# Patient Record
Sex: Female | Born: 1960 | Race: Black or African American | Hispanic: No | Marital: Single | Smoking: Never smoker
Health system: Southern US, Community
[De-identification: ages and names within clinical notes are randomized; demographics above are authoritative.]

## PROBLEM LIST (undated history)

## (undated) DIAGNOSIS — I639 Cerebral infarction, unspecified: Secondary | ICD-10-CM

## (undated) DIAGNOSIS — E041 Nontoxic single thyroid nodule: Secondary | ICD-10-CM

## (undated) DIAGNOSIS — I10 Essential (primary) hypertension: Secondary | ICD-10-CM

## (undated) DIAGNOSIS — J45909 Unspecified asthma, uncomplicated: Secondary | ICD-10-CM

## (undated) DIAGNOSIS — R569 Unspecified convulsions: Secondary | ICD-10-CM

## (undated) DIAGNOSIS — K219 Gastro-esophageal reflux disease without esophagitis: Secondary | ICD-10-CM

## (undated) HISTORY — PX: PARTIAL HYSTERECTOMY: SHX80

---

## 2006-04-20 HISTORY — PX: CORONARY STENT PLACEMENT: SHX1402

## 2008-10-14 ENCOUNTER — Emergency Department (HOSPITAL_COMMUNITY): Admission: EM | Admit: 2008-10-14 | Discharge: 2008-10-14 | Payer: Self-pay | Admitting: Family Medicine

## 2008-10-22 ENCOUNTER — Emergency Department (HOSPITAL_COMMUNITY): Admission: EM | Admit: 2008-10-22 | Discharge: 2008-10-22 | Payer: Self-pay | Admitting: Emergency Medicine

## 2010-07-27 LAB — POCT I-STAT, CHEM 8
BUN: 10 mg/dL (ref 6–23)
Chloride: 103 mEq/L (ref 96–112)
HCT: 44 % (ref 36.0–46.0)
Potassium: 3.9 mEq/L (ref 3.5–5.1)

## 2010-07-27 LAB — HEMOCCULT GUIAC POC 1CARD (OFFICE): Fecal Occult Bld: NEGATIVE

## 2011-10-03 ENCOUNTER — Encounter (HOSPITAL_COMMUNITY): Payer: Self-pay | Admitting: Emergency Medicine

## 2011-10-03 ENCOUNTER — Emergency Department (HOSPITAL_COMMUNITY)
Admission: EM | Admit: 2011-10-03 | Discharge: 2011-10-03 | Disposition: A | Discharge status: Home / Self Care | Payer: Medicare Other | Attending: Emergency Medicine | Admitting: Emergency Medicine

## 2011-10-03 ENCOUNTER — Emergency Department (HOSPITAL_COMMUNITY): Payer: Medicare Other

## 2011-10-03 DIAGNOSIS — Z880 Allergy status to penicillin: Secondary | ICD-10-CM | POA: Insufficient documentation

## 2011-10-03 DIAGNOSIS — E119 Type 2 diabetes mellitus without complications: Secondary | ICD-10-CM | POA: Insufficient documentation

## 2011-10-03 DIAGNOSIS — Z79899 Other long term (current) drug therapy: Secondary | ICD-10-CM | POA: Insufficient documentation

## 2011-10-03 DIAGNOSIS — K219 Gastro-esophageal reflux disease without esophagitis: Secondary | ICD-10-CM | POA: Insufficient documentation

## 2011-10-03 DIAGNOSIS — R079 Chest pain, unspecified: Secondary | ICD-10-CM | POA: Insufficient documentation

## 2011-10-03 DIAGNOSIS — R569 Unspecified convulsions: Secondary | ICD-10-CM | POA: Insufficient documentation

## 2011-10-03 DIAGNOSIS — I10 Essential (primary) hypertension: Secondary | ICD-10-CM | POA: Insufficient documentation

## 2011-10-03 DIAGNOSIS — F419 Anxiety disorder, unspecified: Secondary | ICD-10-CM

## 2011-10-03 DIAGNOSIS — F411 Generalized anxiety disorder: Secondary | ICD-10-CM | POA: Insufficient documentation

## 2011-10-03 DIAGNOSIS — J45909 Unspecified asthma, uncomplicated: Secondary | ICD-10-CM | POA: Insufficient documentation

## 2011-10-03 DIAGNOSIS — Z888 Allergy status to other drugs, medicaments and biological substances status: Secondary | ICD-10-CM | POA: Insufficient documentation

## 2011-10-03 DIAGNOSIS — Z886 Allergy status to analgesic agent status: Secondary | ICD-10-CM | POA: Insufficient documentation

## 2011-10-03 HISTORY — DX: Essential (primary) hypertension: I10

## 2011-10-03 HISTORY — DX: Gastro-esophageal reflux disease without esophagitis: K21.9

## 2011-10-03 HISTORY — DX: Unspecified asthma, uncomplicated: J45.909

## 2011-10-03 HISTORY — DX: Unspecified convulsions: R56.9

## 2011-10-03 LAB — DIFFERENTIAL
Basophils Absolute: 0 10*3/uL (ref 0.0–0.1)
Lymphocytes Relative: 47 % — ABNORMAL HIGH (ref 12–46)
Lymphs Abs: 3.3 10*3/uL (ref 0.7–4.0)
Monocytes Absolute: 0.7 10*3/uL (ref 0.1–1.0)
Monocytes Relative: 10 % (ref 3–12)
Neutro Abs: 2.9 10*3/uL (ref 1.7–7.7)

## 2011-10-03 LAB — BASIC METABOLIC PANEL
BUN: 10 mg/dL (ref 6–23)
CO2: 28 mEq/L (ref 19–32)
Chloride: 100 mEq/L (ref 96–112)
Creatinine, Ser: 0.91 mg/dL (ref 0.50–1.10)
Glucose, Bld: 123 mg/dL — ABNORMAL HIGH (ref 70–99)

## 2011-10-03 LAB — URINALYSIS, ROUTINE W REFLEX MICROSCOPIC
Glucose, UA: NEGATIVE mg/dL
Hgb urine dipstick: NEGATIVE
Ketones, ur: NEGATIVE mg/dL
Leukocytes, UA: NEGATIVE
pH: 7.5 (ref 5.0–8.0)

## 2011-10-03 LAB — POCT I-STAT TROPONIN I: Troponin i, poc: 0 ng/mL (ref 0.00–0.08)

## 2011-10-03 LAB — CBC
HCT: 36.5 % (ref 36.0–46.0)
Hemoglobin: 11.9 g/dL — ABNORMAL LOW (ref 12.0–15.0)
RBC: 5.4 MIL/uL — ABNORMAL HIGH (ref 3.87–5.11)
WBC: 7 10*3/uL (ref 4.0–10.5)

## 2011-10-03 MED ORDER — NITROGLYCERIN 0.4 MG SL SUBL
0.4000 mg | SUBLINGUAL_TABLET | SUBLINGUAL | Status: DC | PRN
  Administered 2011-10-03: 0.4 mg via SUBLINGUAL

## 2011-10-03 MED ORDER — LORAZEPAM 1 MG PO TABS: 1.0000 mg | ORAL_TABLET | Freq: Three times a day (TID) | ORAL | Status: AC | PRN

## 2011-10-03 MED ORDER — CARVEDILOL 25 MG PO TABS
25.0000 mg | ORAL_TABLET | Freq: Once | ORAL | Status: AC
  Administered 2011-10-03: 25 mg via ORAL
  Filled 2011-10-03 (×2): qty 1

## 2011-10-03 MED ORDER — LORAZEPAM 1 MG PO TABS
1.0000 mg | ORAL_TABLET | Freq: Once | ORAL | Status: AC
  Administered 2011-10-03: 1 mg via ORAL
  Filled 2011-10-03: qty 1

## 2011-10-03 NOTE — ED Notes (Signed)
PER EMS- Pt picked up from [facility] for CP. Pt very anxious. Pain radiates to jaw. Previous stents placed. 1 NTG, relieved pain. Pain increases upon palpation. Allergic to aspirin, PCN, Ultram, Sulfa drugs. Vitals WNL. Alertx4.

## 2011-10-03 NOTE — ED Notes (Signed)
Pt aslo report that she has seen GI doc for blood in her stool

## 2011-10-03 NOTE — ED Provider Notes (Signed)
History     CSN: [id number]  Arrival date & time 10/03/11  0048   First MD Initiated Contact with Patient 10/03/11 0107      Chief Complaint  Patient presents with  . Chest Pain    (Consider location/radiation/quality/duration/timing/severity/associated sxs/prior treatment) Patient is a 51 y.o. female presenting with chest pain. The history is provided by the patient.  Chest Pain The chest pain began 6 - 12 hours ago. Chest pain occurs constantly. The chest pain is unchanged. The severity of the pain is moderate. The quality of the pain is described as pressure-like. The pain radiates to the left jaw and left neck. Chest pain is worsened by stress. Primary symptoms include fatigue, nausea and dizziness. Pertinent negatives for primary symptoms include no fever, no shortness of breath, no cough, no wheezing, no palpitations, no abdominal pain and no vomiting.  Dizziness also occurs with nausea and weakness. Dizziness does not occur with vomiting.   Associated symptoms include lower extremity edema and weakness. She tried nitroglycerin for the symptoms.     Past Medical History  Diagnosis Date  . Diabetes mellitus   . Hypertension   . Seizures   . Asthma   . GERD (gastroesophageal reflux disease)     History reviewed. No pertinent past surgical history.  No family history on file.  History  Substance Use Topics  . Smoking status: Not on file  . Smokeless tobacco: Not on file  . Alcohol Use:     OB History    Grav Para Term Preterm Abortions TAB SAB Ect Mult Living                  Review of Systems  Constitutional: Positive for fatigue. Negative for fever.  Respiratory: Negative for cough, shortness of breath and wheezing.   Cardiovascular: Positive for chest pain. Negative for palpitations.  Gastrointestinal: Positive for nausea. Negative for vomiting and abdominal pain.  Neurological: Positive for dizziness and weakness.    Allergies  Aspirin; Ultram;  Valtrex; and Penicillins  Home Medications   Current Outpatient Rx  Name Route Sig Dispense Refill  . ACETAMINOPHEN 500 MG PO TABS Oral Take 500 mg by mouth every 6 (six) hours as needed. For pain    . AMITRIPTYLINE HCL PO Oral Take by mouth.    . ATORVASTATIN CALCIUM 20 MG PO TABS Oral Take 20 mg by mouth daily.    . AZILSARTAN MEDOXOMIL 80 MG PO TABS Oral Take 80 mg by mouth daily.    Marland Kitchen CARVEDILOL 25 MG PO TABS Oral Take 25 mg by mouth 2 (two) times daily with a meal.    . CHLORTHALIDONE 25 MG PO TABS Oral Take 25 mg by mouth daily.    Marland Kitchen CLOPIDOGREL BISULFATE 75 MG PO TABS Oral Take 75 mg by mouth daily.    Marland Kitchen DIVALPROEX SODIUM 500 MG PO TBEC Oral Take 500 mg by mouth 3 (three) times daily.    Marland Kitchen DOXEPIN HCL 25 MG PO CAPS Oral Take 25 mg by mouth every other day.    . ESOMEPRAZOLE MAGNESIUM 40 MG PO CPDR Oral Take 40 mg by mouth daily before breakfast.    . FLUTICASONE-SALMETEROL 250-50 MCG/DOSE IN AEPB Inhalation Inhale 1 puff into the lungs daily.    Marland Kitchen METFORMIN HCL 500 MG PO TABS Oral Take 500 mg by mouth 2 (two) times daily as needed. According to cbg, for sugar    . NITROGLYCERIN 0.4 MG SL SUBL Sublingual Place 0.4 mg under the  tongue every 5 (five) minutes as needed. For chest pain    . PREGABALIN 100 MG PO CAPS Oral Take 100 mg by mouth daily.    Marland Kitchen RANITIDINE HCL 150 MG PO TABS Oral Take 150 mg by mouth daily as needed. For indigestion      BP 154/86  Pulse 84  Temp 98.5 F (36.9 C) (Oral)  Resp 22  SpO2 100%  Physical Exam  Nursing note and vitals reviewed. Constitutional: She is oriented to person, place, and time. She appears well-developed and well-nourished. No distress.  Eyes: Conjunctivae are normal.  Neck: Neck supple.  Cardiovascular: Normal rate, regular rhythm and normal heart sounds.   Pulmonary/Chest: Effort normal and breath sounds normal. No respiratory distress. She has no wheezes. She has no rales.  Abdominal: Soft. Bowel sounds are normal. She exhibits no  distension. There is no tenderness.  Musculoskeletal: Normal range of motion.       1+ bilateral lower extremity edema  Neurological: She is alert and oriented to person, place, and time.  Skin: Skin is warm and dry.  Psychiatric: She has a normal mood and affect.    ED Course  Procedures (including critical care time)   Date: 10/03/2011  Rate: 83  Rhythm: normal sinus rhythm  QRS Axis: normal  Intervals: normal  ST/T Wave abnormalities: normal  Conduction Disutrbances:none  Narrative Interpretation:   Old EKG Reviewed: none available  Pt with chest pain, sounds atypical, pain is constant, no sob. Will get enzymes, labs Results for orders placed during the hospital encounter of 10/03/11  CBC      Component Value Range   WBC 7.0  4.0 - 10.5 K/uL   RBC 5.40 (*) 3.87 - 5.11 MIL/uL   Hemoglobin 11.9 (*) 12.0 - 15.0 g/dL   HCT 16.1  09.6 - 04.5 %   MCV 67.6 (*) 78.0 - 100.0 fL   MCH 22.0 (*) 26.0 - 34.0 pg   MCHC 32.6  30.0 - 36.0 g/dL   RDW 40.9 (*) 81.1 - 91.4 %   Platelets 240  150 - 400 K/uL  DIFFERENTIAL      Component Value Range   Neutrophils Relative 41 (*) 43 - 77 %   Neutro Abs 2.9  1.7 - 7.7 K/uL   Lymphocytes Relative 47 (*) 12 - 46 %   Lymphs Abs 3.3  0.7 - 4.0 K/uL   Monocytes Relative 10  3 - 12 %   Monocytes Absolute 0.7  0.1 - 1.0 K/uL   Eosinophils Relative 1  0 - 5 %   Eosinophils Absolute 0.1  0.0 - 0.7 K/uL   Basophils Relative 0  0 - 1 %   Basophils Absolute 0.0  0.0 - 0.1 K/uL  BASIC METABOLIC PANEL      Component Value Range   Sodium 139  135 - 145 mEq/L   Potassium 3.4 (*) 3.5 - 5.1 mEq/L   Chloride 100  96 - 112 mEq/L   CO2 28  19 - 32 mEq/L   Glucose, Bld 123 (*) 70 - 99 mg/dL   BUN 10  6 - 23 mg/dL   Creatinine, Ser 7.82  0.50 - 1.10 mg/dL   Calcium 9.6  8.4 - 95.6 mg/dL   GFR calc non Af Amer 72 (*) >90 mL/min   GFR calc Af Amer 83 (*) >90 mL/min  URINALYSIS, ROUTINE W REFLEX MICROSCOPIC      Component Value Range   Color, Urine  YELLOW  YELLOW  APPearance CLOUDY (*) CLEAR   Specific Gravity, Urine 1.012  1.005 - 1.030   pH 7.5  5.0 - 8.0   Glucose, UA NEGATIVE  NEGATIVE mg/dL   Hgb urine dipstick NEGATIVE  NEGATIVE   Bilirubin Urine NEGATIVE  NEGATIVE   Ketones, ur NEGATIVE  NEGATIVE mg/dL   Protein, ur NEGATIVE  NEGATIVE mg/dL   Urobilinogen, UA 0.2  0.0 - 1.0 mg/dL   Nitrite NEGATIVE  NEGATIVE   Leukocytes, UA NEGATIVE  NEGATIVE  POCT I-STAT TROPONIN I      Component Value Range   Troponin i, poc 0.00  0.00 - 0.08 ng/mL   Comment 3           POCT I-STAT TROPONIN I      Component Value Range   Troponin i, poc 0.00  0.00 - 0.08 ng/mL   Comment 3            Dg Chest 2 View  10/03/2011  *RADIOLOGY REPORT*  Clinical Data: Worsening chest pain.  CHEST - 2 VIEW  Comparison: None.  Findings: The lungs are well-aerated.  Minimal bibasilar opacities may reflect atelectasis or scarring.  There is no evidence of pleural effusion or pneumothorax.  The heart is borderline normal in size; the mediastinal contour is within normal limits.  No acute osseous abnormalities are seen.  IMPRESSION: Minimal bibasilar airspace opacities may reflect atelectasis or scarring.  No acute cardiopulmonary process seen.  Original Report Authenticated By: Tonia Ghent, M.D.    5:31 AM No signinficant findings to explain pt's pain on CXR or labs. Two sets of enzymes obtained are negative. Pt is very anxious, pts daughter apparently at the hospital right now. Discussed results and plan with the pt. Pt would rather not stay in the hospital, she wants to go be with her daughter. I do not suspect this is cardiac. Will d/c home with follow up with her PCP and cardiologist on Monday.     1. Chest pain   2. Anxiety       MDM          Lottie Mussel, PA 10/03/11 [phone]

## 2011-10-03 NOTE — ED Notes (Signed)
Pt continues to c/o L clavicle pain.  PA Lemont Fillers is aware.  Pain increases when she moves.

## 2011-10-03 NOTE — Discharge Instructions (Signed)
Your labs and x-ray are not significant for any major findings. Cotninue all your current medications. Take tylenol for pain. Take ativan for anxiety as prescribed as needed. Follow up with your doctor on Monday. Return if worsening.   Chest Pain (Nonspecific) It is often hard to give a specific diagnosis for the cause of chest pain. There is always a chance that your pain could be related to something serious, such as a heart attack or a blood clot in the lungs. You need to follow up with your caregiver for further evaluation. CAUSES   Heartburn.   Pneumonia or bronchitis.   Anxiety or stress.   Inflammation around your heart (pericarditis) or lung (pleuritis or pleurisy).   A blood clot in the lung.   A collapsed lung (pneumothorax). It can develop suddenly on its own (spontaneous pneumothorax) or from injury (trauma) to the chest.   Shingles infection (herpes zoster virus).  The chest wall is composed of bones, muscles, and cartilage. Any of these can be the source of the pain.  The bones can be bruised by injury.   The muscles or cartilage can be strained by coughing or overwork.   The cartilage can be affected by inflammation and become sore (costochondritis).  DIAGNOSIS  Lab tests or other studies, such as X-rays, electrocardiography, stress testing, or cardiac imaging, may be needed to find the cause of your pain.  TREATMENT   Treatment depends on what may be causing your chest pain. Treatment may include:   Acid blockers for heartburn.   Anti-inflammatory medicine.   Pain medicine for inflammatory conditions.   Antibiotics if an infection is present.   You may be advised to change lifestyle habits. This includes stopping smoking and avoiding alcohol, caffeine, and chocolate.   You may be advised to keep your head raised (elevated) when sleeping. This reduces the chance of acid going backward from your stomach into your esophagus.   Most of the time, nonspecific  chest pain will improve within 2 to 3 days with rest and mild pain medicine.  HOME CARE INSTRUCTIONS   If antibiotics were prescribed, take your antibiotics as directed. Finish them even if you start to feel better.   For the next few days, avoid physical activities that bring on chest pain. Continue physical activities as directed.   Do not smoke.   Avoid drinking alcohol.   Only take over-the-counter or prescription medicine for pain, discomfort, or fever as directed by your caregiver.   Follow your caregiver's suggestions for further testing if your chest pain does not go away.   Keep any follow-up appointments you made. If you do not go to an appointment, you could develop lasting (chronic) problems with pain. If there is any problem keeping an appointment, you must call to reschedule.  SEEK MEDICAL CARE IF:   You think you are having problems from the medicine you are taking. Read your medicine instructions carefully.   Your chest pain does not go away, even after treatment.   You develop a rash with blisters on your chest.  SEEK IMMEDIATE MEDICAL CARE IF:   You have increased chest pain or pain that spreads to your arm, neck, jaw, back, or abdomen.   You develop shortness of breath, an increasing cough, or you are coughing up blood.   You have severe back or abdominal pain, feel nauseous, or vomit.   You develop severe weakness, fainting, or chills.   You have a fever.  THIS IS AN EMERGENCY.  Do not wait to see if the pain will go away. Get medical help at once. Call your local emergency services (911 in [place] Do not drive yourself to the hospital. MAKE SURE YOU:   Understand these instructions.   Will watch your condition.   Will get help right away if you are not doing well or get worse.  Document Released: 01/14/2005 Document Revised: 03/26/2011 Document Reviewed: 11/10/2007 [facility] Patient Information 2012 [place], [place]. Stress Stress-related medical problems  are becoming increasingly common. The body has a built-in physical response to stressful situations. Faced with pressure, challenge or danger, we need to react quickly. Our bodies release hormones such as cortisol and adrenaline to help do this. These hormones are part of the "fight or flight" response and affect the metabolic rate, heart rate and blood pressure, resulting in a heightened, stressed state that prepares the body for optimum performance in dealing with a stressful situation. It is likely that early man required these mechanisms to stay alive, but usually modern stresses do not call for this, and the same hormones released in today's world can damage health and reduce coping ability. CAUSES  Pressure to perform at work, at school or in sports.   Threats of physical violence.   Money worries.   Arguments.   Family conflicts.   Divorce or separation from significant other.   Bereavement.   New job or unemployment.   Changes in location.   Alcohol or drug abuse.  SOMETIMES, THERE IS NO PARTICULAR REASON FOR DEVELOPING STRESS. Almost all people are at risk of being stressed at some time in their lives. It is important to know that some stress is temporary and some is long term.  Temporary stress will go away when a situation is resolved. Most people can cope with short periods of stress, and it can often be relieved by relaxing, taking a walk, chatting through issues with friends, or having a good night's sleep.   Chronic (long-term, continuous) stress is much harder to deal with. It can be psychologically and emotionally damaging. It can be harmful both for an individual and for friends and family.  SYMPTOMS Everyone reacts to stress differently. There are some common effects that help [place] recognize it. In times of extreme stress, people may:  Shake uncontrollably.   Breathe faster and deeper than normal (hyperventilate).   Vomit.   For people with asthma, stress can  trigger an attack.   For some people, stress may trigger migraine headaches, ulcers, and body pain.  PHYSICAL EFFECTS OF STRESS MAY INCLUDE:  Loss of energy.   Skin problems.   Aches and pains resulting from tense muscles, including neck ache, backache and tension headaches.   Increased pain from arthritis and other conditions.   Irregular heart beat (palpitations).   Periods of irritability or anger.   Apathy or depression.   Anxiety (feeling uptight or worrying).   Unusual behavior.   Loss of appetite.   Comfort eating.   Lack of concentration.   Loss of, or decreased, sex-drive.   Increased smoking, drinking, or recreational drug use.   For women, missed periods.   Ulcers, joint pain, and muscle pain.  Post-traumatic stress is the stress caused by any serious accident, strong emotional damage, or extremely difficult or violent experience such as rape or war. Post-traumatic stress victims can experience mixtures of emotions such as fear, shame, depression, guilt or anger. It may include recurrent memories or images that may be haunting. These feelings can last  for weeks, months or even years after the traumatic event that triggered them. Specialized treatment, possibly with medicines and psychological therapies, is available. If stress is causing physical symptoms, severe distress or making it difficult for you to function as normal, it is worth seeing your caregiver. It is important to remember that although stress is a usual part of life, extreme or prolonged stress can lead to other illnesses that will need treatment. It is better to visit a doctor sooner rather than later. Stress has been linked to the development of high blood pressure and heart disease, as well as insomnia and depression. There is no diagnostic test for stress since everyone reacts to it differently. But a caregiver will be able to spot the physical symptoms, such as:  Headaches.   Shingles.    Ulcers.  Emotional distress such as intense worry, low mood or irritability should be detected when the doctor asks pertinent questions to identify any underlying problems that might be the cause. In case there are physical reasons for the symptoms, the doctor may also want to do some tests to exclude certain conditions. If you feel that you are suffering from stress, try to identify the aspects of your life that are causing it. Sometimes you may not be able to change or avoid them, but even a small change can have a positive ripple effect. A simple lifestyle change can make all the difference. STRATEGIES THAT CAN HELP DEAL WITH STRESS:  Delegating or sharing responsibilities.   Avoiding confrontations.   Learning to be more assertive.   Regular exercise.   Avoid using alcohol or street drugs to cope.   Eating a healthy, balanced diet, rich in fruit and vegetables and proteins.   Finding humor or absurdity in stressful situations.   Never taking on more than you know you can handle comfortably.   Organizing your time better to get as much done as possible.   Talking to friends or family and sharing your thoughts and fears.   Listening to music or relaxation tapes.   Tensing and then relaxing your muscles, starting at the toes and working up to the head and neck.  If you think that you would benefit from help, either in identifying the things that are causing your stress or in learning techniques to help you relax, see a caregiver who is capable of helping you with this. Rather than relying on medications, it is usually better to try and identify the things in your life that are causing stress and try to deal with them. There are many techniques of managing stress including counseling, psychotherapy, aromatherapy, yoga, and exercise. Your caregiver can help you determine what is best for you. Document Released: 06/27/2002 Document Revised: 03/26/2011 Document Reviewed:  05/24/2007 [facility] Patient Information 2012 [place], [place].

## 2011-10-04 NOTE — ED Provider Notes (Signed)
Medical screening examination/treatment/procedure(s) were performed by non-physician practitioner and as supervising physician I was immediately available for consultation/collaboration.  Ventura Hollenbeck, MD 10/04/11 1908 

## 2012-06-13 ENCOUNTER — Encounter (HOSPITAL_COMMUNITY): Payer: Self-pay | Admitting: *Deleted

## 2012-06-13 ENCOUNTER — Emergency Department (HOSPITAL_COMMUNITY)
Admission: EM | Admit: 2012-06-13 | Discharge: 2012-06-13 | Disposition: A | Discharge status: Home / Self Care | Payer: Medicare Other | Attending: Emergency Medicine | Admitting: Emergency Medicine

## 2012-06-13 ENCOUNTER — Emergency Department (INDEPENDENT_AMBULATORY_CARE_PROVIDER_SITE_OTHER)
Admission: EM | Admit: 2012-06-13 | Discharge: 2012-06-13 | Disposition: A | Discharge status: Nursing Home (Includes ICF) | Payer: Medicare Other | Source: Home / Self Care | Attending: Emergency Medicine | Admitting: Emergency Medicine

## 2012-06-13 ENCOUNTER — Encounter (HOSPITAL_COMMUNITY): Payer: Self-pay | Admitting: Emergency Medicine

## 2012-06-13 ENCOUNTER — Emergency Department (HOSPITAL_COMMUNITY): Payer: Medicare Other

## 2012-06-13 DIAGNOSIS — Z79899 Other long term (current) drug therapy: Secondary | ICD-10-CM | POA: Insufficient documentation

## 2012-06-13 DIAGNOSIS — R109 Unspecified abdominal pain: Secondary | ICD-10-CM

## 2012-06-13 DIAGNOSIS — Z7902 Long term (current) use of antithrombotics/antiplatelets: Secondary | ICD-10-CM | POA: Insufficient documentation

## 2012-06-13 DIAGNOSIS — R197 Diarrhea, unspecified: Secondary | ICD-10-CM | POA: Insufficient documentation

## 2012-06-13 DIAGNOSIS — G40909 Epilepsy, unspecified, not intractable, without status epilepticus: Secondary | ICD-10-CM | POA: Insufficient documentation

## 2012-06-13 DIAGNOSIS — E119 Type 2 diabetes mellitus without complications: Secondary | ICD-10-CM | POA: Insufficient documentation

## 2012-06-13 DIAGNOSIS — I10 Essential (primary) hypertension: Secondary | ICD-10-CM | POA: Insufficient documentation

## 2012-06-13 DIAGNOSIS — IMO0002 Reserved for concepts with insufficient information to code with codable children: Secondary | ICD-10-CM | POA: Insufficient documentation

## 2012-06-13 DIAGNOSIS — Z8742 Personal history of other diseases of the female genital tract: Secondary | ICD-10-CM | POA: Insufficient documentation

## 2012-06-13 DIAGNOSIS — Z9114 Patient's other noncompliance with medication regimen: Secondary | ICD-10-CM

## 2012-06-13 DIAGNOSIS — Z9119 Patient's noncompliance with other medical treatment and regimen: Secondary | ICD-10-CM | POA: Insufficient documentation

## 2012-06-13 DIAGNOSIS — R112 Nausea with vomiting, unspecified: Secondary | ICD-10-CM | POA: Insufficient documentation

## 2012-06-13 DIAGNOSIS — Z91199 Patient's noncompliance with other medical treatment and regimen due to unspecified reason: Secondary | ICD-10-CM | POA: Insufficient documentation

## 2012-06-13 DIAGNOSIS — R079 Chest pain, unspecified: Secondary | ICD-10-CM | POA: Insufficient documentation

## 2012-06-13 DIAGNOSIS — J45909 Unspecified asthma, uncomplicated: Secondary | ICD-10-CM | POA: Insufficient documentation

## 2012-06-13 DIAGNOSIS — K219 Gastro-esophageal reflux disease without esophagitis: Secondary | ICD-10-CM | POA: Insufficient documentation

## 2012-06-13 DIAGNOSIS — Z8701 Personal history of pneumonia (recurrent): Secondary | ICD-10-CM | POA: Insufficient documentation

## 2012-06-13 LAB — COMPREHENSIVE METABOLIC PANEL
BUN: 9 mg/dL (ref 6–23)
CO2: 24 mEq/L (ref 19–32)
Calcium: 9.8 mg/dL (ref 8.4–10.5)
Chloride: 100 mEq/L (ref 96–112)
Creatinine, Ser: 0.65 mg/dL (ref 0.50–1.10)
GFR calc Af Amer: >90 mL/min (ref >90)
GFR calc non Af Amer: >90 mL/min (ref >90)
Glucose, Bld: 83 mg/dL (ref 70–99)
Total Bilirubin: 0.4 mg/dL (ref 0.3–1.2)

## 2012-06-13 LAB — URINALYSIS, ROUTINE W REFLEX MICROSCOPIC
Glucose, UA: NEGATIVE mg/dL
Nitrite: NEGATIVE
Protein, ur: NEGATIVE mg/dL
Urobilinogen, UA: 0.2 mg/dL (ref 0.0–1.0)

## 2012-06-13 LAB — URINE MICROSCOPIC-ADD ON

## 2012-06-13 LAB — POCT URINALYSIS DIP (DEVICE)
Bilirubin Urine: NEGATIVE
Ketones, ur: NEGATIVE mg/dL
Leukocytes, UA: NEGATIVE
Protein, ur: NEGATIVE mg/dL

## 2012-06-13 LAB — CBC WITH DIFFERENTIAL/PLATELET
Basophils Absolute: 0 10*3/uL (ref 0.0–0.1)
HCT: 38.4 % (ref 36.0–46.0)
Lymphocytes Relative: 40 % (ref 12–46)
Monocytes Relative: 7 % (ref 3–12)
Neutro Abs: 3.8 10*3/uL (ref 1.7–7.7)
Neutrophils Relative %: 53 % (ref 43–77)
RDW: 15.6 % — ABNORMAL HIGH (ref 11.5–15.5)
WBC: 7.2 10*3/uL (ref 4.0–10.5)

## 2012-06-13 LAB — VALPROIC ACID LEVEL: Valproic Acid Lvl: <10 ug/mL — ABNORMAL LOW (ref 50.0–100.0)

## 2012-06-13 LAB — LIPASE, BLOOD: Lipase: 29 U/L (ref 11–59)

## 2012-06-13 LAB — POCT I-STAT TROPONIN I: Troponin i, poc: 0 ng/mL (ref 0.00–0.08)

## 2012-06-13 MED ORDER — PANTOPRAZOLE SODIUM 40 MG IV SOLR
80.0000 mg | Freq: Once | INTRAVENOUS | Status: AC
  Administered 2012-06-13: 80 mg via INTRAVENOUS
  Filled 2012-06-13: qty 80

## 2012-06-13 MED ORDER — ONDANSETRON HCL 4 MG/2ML IJ SOLN
4.0000 mg | Freq: Once | INTRAMUSCULAR | Status: AC
  Administered 2012-06-13: 4 mg via INTRAVENOUS
  Filled 2012-06-13: qty 2

## 2012-06-13 MED ORDER — DIVALPROEX SODIUM 250 MG PO DR TAB
500.0000 mg | DELAYED_RELEASE_TABLET | Freq: Once | ORAL | Status: AC
  Administered 2012-06-13: 500 mg via ORAL
  Filled 2012-06-13: qty 2

## 2012-06-13 MED ORDER — ESOMEPRAZOLE MAGNESIUM 40 MG PO CPDR: 40.0000 mg | DELAYED_RELEASE_CAPSULE | Freq: Every day | ORAL | Status: DC

## 2012-06-13 MED ORDER — MORPHINE SULFATE 4 MG/ML IJ SOLN
4.0000 mg | Freq: Once | INTRAMUSCULAR | Status: AC
  Administered 2012-06-13: 2 mg via INTRAVENOUS
  Filled 2012-06-13: qty 1

## 2012-06-13 MED ORDER — GI COCKTAIL ~~LOC~~
30.0000 mL | Freq: Once | ORAL | Status: AC
  Administered 2012-06-13: 30 mL via ORAL
  Filled 2012-06-13: qty 30

## 2012-06-13 MED ORDER — GI COCKTAIL ~~LOC~~: 30.0000 mL | Freq: Once | ORAL | Status: DC

## 2012-06-13 MED ORDER — SODIUM CHLORIDE 0.9 % IV BOLUS (SEPSIS)
1000.0000 mL | Freq: Once | INTRAVENOUS | Status: AC
  Administered 2012-06-13: 1000 mL via INTRAVENOUS

## 2012-06-13 MED ORDER — LORAZEPAM 2 MG/ML IJ SOLN
1.0000 mg | Freq: Once | INTRAMUSCULAR | Status: AC
  Administered 2012-06-13: 1 mg via INTRAVENOUS
  Filled 2012-06-13: qty 1

## 2012-06-13 MED ORDER — DIVALPROEX SODIUM ER 500 MG PO TB24: 500.0000 mg | ORAL_TABLET | Freq: Three times a day (TID) | ORAL | Status: DC

## 2012-06-13 NOTE — ED Notes (Addendum)
Pt c/o abd pain since 0330 today that awoken her Sx include: abd swelling, headache, nauseas, vomiting (last episode was 0730) Denies: f/d, constipation, urinary prob, hematuria Pain is constant w/intermittent sharp pains.  Has not had any meds for discomfort Hx of c-sections  She is alert w/mild discomfort due pains

## 2012-06-13 NOTE — ED Notes (Signed)
Pt returned from Xray. IV team at bedside. AO x4.

## 2012-06-13 NOTE — ED Notes (Signed)
IV team unable to start line. PA Abigail made aware. PT AO x4. Delay explained to pt.

## 2012-06-13 NOTE — ED Notes (Signed)
IV team at bedside x 2.

## 2012-06-13 NOTE — ED Notes (Signed)
Two nurses attempted IV. IV team paged  

## 2012-06-13 NOTE — ED Provider Notes (Signed)
History     CSN: [id number]  Arrival date & time 06/13/12  1331   First MD Initiated Contact with Patient 06/13/12 1626      Chief Complaint  Patient presents with  . Chest Pain  . Abdominal Pain    (Consider location/radiation/quality/duration/timing/severity/associated sxs/prior treatment) The history is provided by the patient and medical records. No language interpreter was used.   52 year old female [facility]. Patient is a poor historian. History is limited by the mental capacity of the patient, patient's ability to communicate effectively, and overall poor insight.  Patient states that last night she began developing epigastric abdominal pain.  This morning she awoke from sleep with nausea and vomiting.  Patient now has left sided chest pain that she characterizes as a squeezing.  The patient frequently grabs her left shoulder during examination.  The patient does have a past history of GERD.  She takes Nexium.  The patient is from out of town and has not been taking her Nexium over the past couple of days.  Patient also states that she developed foul-smelling watery light-colored stools today.  Recent states that she has been on an antibiotic recently for pneumonia.  She states she thinks it was clindamycin.  Patient denies any urinary or vaginal symptoms.  Patient states that her pain is colic with colicky and feels like "pregnancy pains."  She is unsure of her last menstrual period as she has had amenorrhea since her 32s and did have an episode of unprotected sex in January. And is a nonsmoker, no family history or personal history of heart attack or stroke.  The patient is diabetic with hypertension.  She is overweight.  Past Medical History  Diagnosis Date  . Diabetes mellitus   . Hypertension   . Seizures   . Asthma   . GERD (gastroesophageal reflux disease)     History reviewed. No pertinent past surgical  history.  No family history on file.  History  Substance Use Topics  . Smoking status: Never Smoker   . Smokeless tobacco: Not on file  . Alcohol Use: No    OB History   Grav Para Term Preterm Abortions TAB SAB Ect Mult Living                  Review of Systems Ten systems reviewed and are negative for acute change, except as noted in the HPI.   Allergies  Aspirin; Ultram; Valtrex; and Penicillins  Home Medications   Current Outpatient Rx  Name  Route  Sig  Dispense  Refill  . acetaminophen (TYLENOL) 500 MG tablet   Oral   Take 500 mg by mouth every 6 (six) hours as needed. For pain         . albuterol (PROVENTIL) (5 MG/ML) 0.5% nebulizer solution   Nebulization   Take 2.5 mg by nebulization every 6 (six) hours as needed for wheezing.         Marland Kitchen amitriptyline (ELAVIL) 25 MG tablet   Oral   Take 25 mg by mouth at bedtime.         Marland Kitchen amLODipine (NORVASC) 10 MG tablet   Oral   Take 10 mg by mouth daily.         Marland Kitchen atorvastatin (LIPITOR) 20 MG tablet   Oral   Take 20 mg by mouth daily.         . Azilsartan Medoxomil 80 MG TABS   Oral  Take 80 mg by mouth daily.         . baclofen (LIORESAL) 20 MG tablet   Oral   Take 20 mg by mouth 3 (three) times daily.         . budesonide-formoterol (SYMBICORT) 80-4.5 MCG/ACT inhaler   Inhalation   Inhale 2 puffs into the lungs 2 (two) times daily.         . carvedilol (COREG) 25 MG tablet   Oral   Take 25 mg by mouth 2 (two) times daily with a meal.         . chlorthalidone (HYGROTON) 25 MG tablet   Oral   Take 25 mg by mouth daily.         . clopidogrel (PLAVIX) 75 MG tablet   Oral   Take 75 mg by mouth daily.         Marland Kitchen dicyclomine (BENTYL) 10 MG capsule   Oral   Take 10 mg by mouth 4 (four) times daily -  before meals and at bedtime.         . divalproex (DEPAKOTE) 500 MG DR tablet   Oral   Take 500 mg by mouth 3 (three) times daily.         Marland Kitchen doxepin (SINEQUAN) 25 MG capsule    Oral   Take 25 mg by mouth every other day.         . esomeprazole (NEXIUM) 40 MG capsule   Oral   Take 40 mg by mouth daily before breakfast.         . LORazepam (ATIVAN) 1 MG tablet   Oral   Take 1 mg by mouth daily as needed for anxiety.         . metFORMIN (GLUCOPHAGE) 500 MG tablet   Oral   Take 500 mg by mouth 2 (two) times daily as needed. According to cbg, for sugar         . nitroGLYCERIN (NITROSTAT) 0.4 MG SL tablet   Sublingual   Place 0.4 mg under the tongue every 5 (five) minutes as needed. For chest pain         . ranitidine (ZANTAC) 150 MG tablet   Oral   Take 150 mg by mouth daily as needed. For indigestion           BP 177/89  Pulse 83  Temp(Src) 98.2 F (36.8 C) (Oral)  Resp 16  SpO2 98%  Physical Exam  Nursing note and vitals reviewed. Constitutional: She is oriented to person, place, and time. She appears well-developed and well-nourished. No distress.  HENT:  Head: Normocephalic and atraumatic.  Eyes: Conjunctivae normal and EOM are normal. Pupils are equal, round, and reactive to light. No scleral icterus.  Neck: Normal range of motion.  Cardiovascular: Normal rate, regular rhythm and normal heart sounds.  Exam reveals no gallop and no friction rub.   No murmur heard. Pulmonary/Chest: Effort normal and breath sounds normal. No respiratory distress.  Abdominal: Soft. Bowel sounds are normal. She exhibits no distension and no mass. There is no tenderness. There is no guarding.  Neurological: She is alert and oriented to person, place, and time.  Skin: Skin is warm and dry. She is not diaphoretic.    ED Course  Procedures (including critical care time)  Labs Reviewed  CBC WITH DIFFERENTIAL - Abnormal; Notable for the following:    RBC 5.72 (*)    MCV 67.1 (*)    MCH 22.4 (*)  RDW 15.6 (*)    All other components within normal limits  URINALYSIS, ROUTINE W REFLEX MICROSCOPIC - Abnormal; Notable for the following:    APPearance  HAZY (*)    Leukocytes, UA TRACE (*)    All other components within normal limits  VALPROIC ACID LEVEL - Abnormal; Notable for the following:    Valproic Acid Lvl <10.0 (*)    All other components within normal limits  URINE MICROSCOPIC-ADD ON - Abnormal; Notable for the following:    Squamous Epithelial / LPF FEW (*)    Bacteria, UA MANY (*)    All other components within normal limits  GLUCOSE, CAPILLARY  COMPREHENSIVE METABOLIC PANEL  LIPASE, BLOOD  OCCULT BLOOD X 1 CARD TO LAB, STOOL  POCT I-STAT TROPONIN I   No results found.   Date: 06/13/2012  Rate: 81  Rhythm: normal sinus rhythm  QRS Axis: normal  Intervals: normal  ST/T Wave abnormalities: normal  Conduction Disutrbances: none  Narrative Interpretation:   Old EKG Reviewed: No significant changes noted    No diagnosis found.    MDM  5:26 PM BP 177/89  Pulse 83  Temp(Src) 98.2 F (36.8 C) (Oral)  Resp 16  SpO2 98% Patient with epigastric abdominal pain.  She does have risk factors for acute coronary syndrome as well as gastroesophageal reflux disease.  Patient may also have developing gastritis as she has had nausea vomiting and loose stool.  There is possible concern for development of C. difficile colitis due to recent antibiotic use.  The tendon first culture.  She's currently being evaluated for acute care and coronary syndrome.  We'll check a chest x-ray to reevaluate her lungs for pneumonia.   We have had trouble accessing the patinet with IV, which has delayed care.   10:42 PM Paitne states that she is feeling much better.  She has not taken any of her medications since Thursday and her depakote level is low. The patient was tremulous and stated the she  Felt as though she were going to have a seizure.  I have given her one dose of her depakote (500mg ) and ativan. Awaiting her 2nd troponin.   Patient 2nd troponin negative. Patient will be here for the next several days and needs her depakote. I will  prescribe the medication as well as her nexium.  I have advised the patient that she should always take her medications as prescribed and warned her of the consequeces of noncompliance.  Patien denies any pain at this time. Patient is to be/ epigastric pain is not likely of cardiac or pulmonary etiology d/t presentation, perc negative, VSS, no tracheal deviation, no JVD or new murmur, RRR, breath sounds equal bilaterally, EKG without acute abnormalities, negative troponin, and negative CXR. Pt has been advised start a PPI and return to the ED is CP becomes exertional, associated with diaphoresis or nausea, radiates to left jaw/arm, worsens or becomes concerning in any way. Pt appears reliable for follow up and is agreeable to discharge.   Case has been discussed with and seen by Dr. Denton Lank  who agrees with the above plan to discharge.    Arthor Captain, PA-C 06/16/12 2047

## 2012-06-13 NOTE — ED Provider Notes (Signed)
History     CSN: [id number]  Arrival date & time 06/13/12  1148   First MD Initiated Contact with Patient 06/13/12 1222      Chief Complaint  Patient presents with  . Abdominal Pain    (Consider location/radiation/quality/duration/timing/severity/associated sxs/prior treatment) HPI Comments: Patient presents to urgent care this afternoon complaining of severe abdominal pain that woke her up this morning around 3 AM in severe pain. She describes pain, starts in her stomach area ( points to the epigastric region), radiates to her periumbilical area and is also experiencing left-sided chest pain. She has been nauseous and has vomited once. Patient denies any diarrhea as, fevers urinary symptoms such as increased frequency pain or burning with urination. Denies any recent traumas or injuries. Patient has not taken anything for pain.  Patient is a 52 y.o. female presenting with abdominal pain. The history is provided by the patient.  Abdominal Pain Pain location:  Epigastric, periumbilical and RUQ Pain quality: sharp, shooting, squeezing and stabbing   Pain radiates to:  Epigastric region, periumbilical region and chest Pain severity:  Severe Onset quality:  Sudden Duration:  10 hours Progression:  Worsening Context: not diet changes, not previous surgeries, not recent travel, not suspicious food intake and not trauma   Worsened by:  Nothing tried Associated symptoms: anorexia, chest pain, diarrhea, nausea and vomiting   Associated symptoms: no belching, no chills, no constipation, no cough, no dysuria, no fatigue, no fever, no flatus, no hematemesis, no hematochezia, no hematuria, no shortness of breath, no sore throat, no vaginal bleeding and no vaginal discharge   Risk factors: no alcohol abuse, not elderly, no NSAID use, not obese and no recent hospitalization     Past Medical History  Diagnosis Date  . Diabetes mellitus   . Hypertension   . Seizures   . Asthma   . GERD  (gastroesophageal reflux disease)     History reviewed. No pertinent past surgical history.  No family history on file.  History  Substance Use Topics  . Smoking status: Never Smoker   . Smokeless tobacco: Not on file  . Alcohol Use: No    OB History   Grav Para Term Preterm Abortions TAB SAB Ect Mult Living                  Review of Systems  Constitutional: Positive for activity change. Negative for fever, chills, appetite change and fatigue.  HENT: Negative for sore throat.   Respiratory: Negative for cough, chest tightness and shortness of breath.   Cardiovascular: Positive for chest pain.  Gastrointestinal: Positive for nausea, vomiting, abdominal pain, diarrhea and anorexia. Negative for constipation, blood in stool, hematochezia, abdominal distention, anal bleeding, flatus and hematemesis.  Genitourinary: Negative for dysuria, hematuria, vaginal bleeding and vaginal discharge.  Musculoskeletal: Negative for myalgias.  Skin: Negative for pallor.    Allergies  Aspirin; Ultram; Valtrex; and Penicillins  Home Medications   Current Outpatient Rx  Name  Route  Sig  Dispense  Refill  . acetaminophen (TYLENOL) 500 MG tablet   Oral   Take 500 mg by mouth every 6 (six) hours as needed. For pain         . atorvastatin (LIPITOR) 20 MG tablet   Oral   Take 20 mg by mouth daily.         . clopidogrel (PLAVIX) 75 MG tablet   Oral   Take 75 mg by mouth daily.         Marland Kitchen  divalproex (DEPAKOTE) 500 MG DR tablet   Oral   Take 500 mg by mouth 3 (three) times daily.         Marland Kitchen amitriptyline (ELAVIL) 25 MG tablet   Oral   Take 25 mg by mouth at bedtime.         . Azilsartan Medoxomil 80 MG TABS   Oral   Take 80 mg by mouth daily.         . carvedilol (COREG) 25 MG tablet   Oral   Take 25 mg by mouth 2 (two) times daily with a meal.         . chlorthalidone (HYGROTON) 25 MG tablet   Oral   Take 25 mg by mouth daily.         Marland Kitchen doxepin (SINEQUAN) 25 MG  capsule   Oral   Take 25 mg by mouth every other day.         . esomeprazole (NEXIUM) 40 MG capsule   Oral   Take 40 mg by mouth daily before breakfast.         . Fluticasone-Salmeterol (ADVAIR DISKUS) 250-50 MCG/DOSE AEPB   Inhalation   Inhale 1 puff into the lungs daily.         . metFORMIN (GLUCOPHAGE) 500 MG tablet   Oral   Take 500 mg by mouth 2 (two) times daily as needed. According to cbg, for sugar         . nitroGLYCERIN (NITROSTAT) 0.4 MG SL tablet   Sublingual   Place 0.4 mg under the tongue every 5 (five) minutes as needed. For chest pain         . pregabalin (LYRICA) 100 MG capsule   Oral   Take 100 mg by mouth daily.         . ranitidine (ZANTAC) 150 MG tablet   Oral   Take 150 mg by mouth daily as needed. For indigestion           BP 179/100  Pulse 91  Temp(Src) 98.1 F (36.7 C) (Oral)  Resp 23  SpO2 99%  Physical Exam  Nursing note and vitals reviewed. Constitutional: She appears well-developed. No distress.  Eyes: No scleral icterus.  Neck: Neck supple.  Cardiovascular: Normal rate.   Pulmonary/Chest: No respiratory distress. She has no wheezes.  Abdominal: Bowel sounds are normal. She exhibits no distension and no mass. There is no hepatosplenomegaly, splenomegaly or hepatomegaly. There is tenderness in the epigastric area and periumbilical area. There is positive Murphy's sign. There is no rebound, no guarding, no CVA tenderness and no tenderness at McBurney's point. No hernia. Hernia confirmed negative in the ventral area and confirmed negative in the left inguinal area.    Musculoskeletal: She exhibits no tenderness.  Skin: No rash noted. She is not diaphoretic. No erythema.    ED Course  Procedures (including critical care time)  Labs Reviewed  POCT URINALYSIS DIP (DEVICE)   No results found.   EKG normal sinus rhythm. Ventricular rate 85 beats per minute PR interval and QRS duration within normal no prevertebral  location. No ST or T wave changes consistent with acute or remote cardiac ischemia.   MDM   Multisymptom Patient- patient presents urgent care describing severe abdominal pain. Abdominal exam is concerning although nonspecific, area of predominant discomfort was felt to be a right upper quadrant. Patient is also complaining of associated left-sided precordial pain. 12-lead EKG, 12:48 and with no signs of acute or remote cardiac ischemia  no cardiac arrhythmias. Differential diagnosis at this point includes intra-abdominal condition such as gallbladder disorder, pancreatitis we'll transfer patient to the emergency department in stable condition and n.p.o. instructions.  Jimmie Molly, MD 06/13/12 1256

## 2012-06-13 NOTE — ED Notes (Signed)
Unable to draw blood off IV line. Waiting on phlebotomy.

## 2012-06-13 NOTE — ED Notes (Signed)
Pt assessed at request of registration staff; pt c/o periumbilical abdominal pn since early AM (awakened by it). Pt states she does feel waves of nausea, but has not vomited yet. Pt is hypertensive and tachypneic, slightly diaphoretic.

## 2012-06-13 NOTE — ED Notes (Signed)
Pt made aware that we cannot start line at this time. Pt cooperative, understands plan. PA Abigail made aware. VSS. NAD. Pt AO x4.

## 2012-06-13 NOTE — ED Notes (Signed)
Pt sent from urgent care with epigastric and abdominal pain that started last nite at 9pm and increased about 3am.  Pt is diabetic and tender to mid upper abdomen with palpation.  Pt states vomited times one.  No constipation.  History of heart stent.

## 2012-06-13 NOTE — ED Notes (Signed)
Pt given meal. Denies nausea. Reports pain to abdomen 6/10. NAD noted.

## 2012-06-17 NOTE — ED Provider Notes (Signed)
Medical screening examination/treatment/procedure(s) were performed by non-physician practitioner and as supervising physician I was immediately available for consultation/collaboration.   Suzi Roots, MD 06/17/12 1028

## 2012-09-05 ENCOUNTER — Emergency Department (HOSPITAL_COMMUNITY): Payer: Medicare Other

## 2012-09-05 ENCOUNTER — Encounter (HOSPITAL_COMMUNITY): Payer: Self-pay | Admitting: *Deleted

## 2012-09-05 ENCOUNTER — Observation Stay (HOSPITAL_COMMUNITY)
Admission: EM | Admit: 2012-09-05 | Discharge: 2012-09-06 | Disposition: A | Discharge status: Home / Self Care | Payer: Medicare Other | Attending: Internal Medicine | Admitting: Internal Medicine

## 2012-09-05 DIAGNOSIS — R0989 Other specified symptoms and signs involving the circulatory and respiratory systems: Secondary | ICD-10-CM | POA: Insufficient documentation

## 2012-09-05 DIAGNOSIS — E119 Type 2 diabetes mellitus without complications: Secondary | ICD-10-CM | POA: Insufficient documentation

## 2012-09-05 DIAGNOSIS — Z9861 Coronary angioplasty status: Secondary | ICD-10-CM | POA: Insufficient documentation

## 2012-09-05 DIAGNOSIS — K589 Irritable bowel syndrome without diarrhea: Secondary | ICD-10-CM | POA: Insufficient documentation

## 2012-09-05 DIAGNOSIS — R29898 Other symptoms and signs involving the musculoskeletal system: Secondary | ICD-10-CM | POA: Insufficient documentation

## 2012-09-05 DIAGNOSIS — R0609 Other forms of dyspnea: Secondary | ICD-10-CM | POA: Insufficient documentation

## 2012-09-05 DIAGNOSIS — I2510 Atherosclerotic heart disease of native coronary artery without angina pectoris: Secondary | ICD-10-CM | POA: Diagnosis present

## 2012-09-05 DIAGNOSIS — R569 Unspecified convulsions: Secondary | ICD-10-CM | POA: Diagnosis present

## 2012-09-05 DIAGNOSIS — I10 Essential (primary) hypertension: Secondary | ICD-10-CM | POA: Insufficient documentation

## 2012-09-05 DIAGNOSIS — R0789 Other chest pain: Principal | ICD-10-CM | POA: Insufficient documentation

## 2012-09-05 DIAGNOSIS — J45909 Unspecified asthma, uncomplicated: Secondary | ICD-10-CM | POA: Diagnosis present

## 2012-09-05 DIAGNOSIS — E041 Nontoxic single thyroid nodule: Secondary | ICD-10-CM

## 2012-09-05 DIAGNOSIS — Z79899 Other long term (current) drug therapy: Secondary | ICD-10-CM | POA: Insufficient documentation

## 2012-09-05 DIAGNOSIS — I209 Angina pectoris, unspecified: Secondary | ICD-10-CM

## 2012-09-05 DIAGNOSIS — G40909 Epilepsy, unspecified, not intractable, without status epilepticus: Secondary | ICD-10-CM | POA: Insufficient documentation

## 2012-09-05 DIAGNOSIS — K219 Gastro-esophageal reflux disease without esophagitis: Secondary | ICD-10-CM | POA: Insufficient documentation

## 2012-09-05 DIAGNOSIS — I69998 Other sequelae following unspecified cerebrovascular disease: Secondary | ICD-10-CM | POA: Insufficient documentation

## 2012-09-05 DIAGNOSIS — E876 Hypokalemia: Secondary | ICD-10-CM | POA: Insufficient documentation

## 2012-09-05 HISTORY — DX: Cerebral infarction, unspecified: I63.9

## 2012-09-05 HISTORY — DX: Nontoxic single thyroid nodule: E04.1

## 2012-09-05 LAB — URINALYSIS, ROUTINE W REFLEX MICROSCOPIC
Glucose, UA: NEGATIVE mg/dL
Hgb urine dipstick: NEGATIVE
Ketones, ur: NEGATIVE mg/dL
Protein, ur: NEGATIVE mg/dL
pH: 6 (ref 5.0–8.0)

## 2012-09-05 LAB — CBC WITH DIFFERENTIAL/PLATELET
Basophils Relative: 0 % (ref 0–1)
Eosinophils Absolute: 0.1 10*3/uL (ref 0.0–0.7)
Eosinophils Relative: 1 % (ref 0–5)
HCT: 37.7 % (ref 36.0–46.0)
Hemoglobin: 12.6 g/dL (ref 12.0–15.0)
Lymphs Abs: 2.5 10*3/uL (ref 0.7–4.0)
MCH: 22.1 pg — ABNORMAL LOW (ref 26.0–34.0)
MCHC: 33.4 g/dL (ref 30.0–36.0)
MCV: 66.3 fL — ABNORMAL LOW (ref 78.0–100.0)
Monocytes Absolute: 0.8 10*3/uL (ref 0.1–1.0)
Neutro Abs: 3.6 10*3/uL (ref 1.7–7.7)

## 2012-09-05 LAB — TROPONIN I
Troponin I: <0.3 ng/mL (ref <0.30)
Troponin I: <0.3 ng/mL (ref <0.30)

## 2012-09-05 LAB — POCT I-STAT, CHEM 8
BUN: 15 mg/dL (ref 6–23)
Calcium, Ion: 1.23 mmol/L (ref 1.12–1.23)
Chloride: 101 mEq/L (ref 96–112)
Creatinine, Ser: 0.8 mg/dL (ref 0.50–1.10)
TCO2: 31 mmol/L (ref 0–100)

## 2012-09-05 LAB — POCT I-STAT TROPONIN I: Troponin i, poc: 0 ng/mL (ref 0.00–0.08)

## 2012-09-05 LAB — BASIC METABOLIC PANEL
BUN: 9 mg/dL (ref 6–23)
Calcium: 8.9 mg/dL (ref 8.4–10.5)
Chloride: 98 mEq/L (ref 96–112)
Creatinine, Ser: 0.8 mg/dL (ref 0.50–1.10)
GFR calc Af Amer: >90 mL/min (ref >90)

## 2012-09-05 LAB — GLUCOSE, CAPILLARY: Glucose-Capillary: 89 mg/dL (ref 70–99)

## 2012-09-05 MED ORDER — ENOXAPARIN SODIUM 40 MG/0.4ML ~~LOC~~ SOLN
40.0000 mg | SUBCUTANEOUS | Status: DC
  Administered 2012-09-05: 40 mg via SUBCUTANEOUS
  Filled 2012-09-05 (×2): qty 0.4

## 2012-09-05 MED ORDER — ACETAMINOPHEN 325 MG PO TABS
650.0000 mg | ORAL_TABLET | ORAL | Status: DC | PRN
  Administered 2012-09-05 – 2012-09-06 (×2): 650 mg via ORAL
  Filled 2012-09-05 (×2): qty 2

## 2012-09-05 MED ORDER — ASPIRIN 81 MG PO CHEW: 81.0000 mg | CHEWABLE_TABLET | Freq: Every day | ORAL | Status: DC

## 2012-09-05 MED ORDER — SODIUM CHLORIDE 0.9 % IJ SOLN
3.0000 mL | Freq: Two times a day (BID) | INTRAMUSCULAR | Status: DC
  Administered 2012-09-06: 3 mL via INTRAVENOUS

## 2012-09-05 MED ORDER — ALBUTEROL SULFATE (5 MG/ML) 0.5% IN NEBU: 2.5000 mg | INHALATION_SOLUTION | Freq: Four times a day (QID) | RESPIRATORY_TRACT | Status: DC | PRN

## 2012-09-05 MED ORDER — BUDESONIDE-FORMOTEROL FUMARATE 80-4.5 MCG/ACT IN AERO
2.0000 | INHALATION_SPRAY | Freq: Two times a day (BID) | RESPIRATORY_TRACT | Status: DC
Start: 2012-09-05 — End: 2012-09-06
  Administered 2012-09-05 – 2012-09-06 (×2): 2 via RESPIRATORY_TRACT
  Filled 2012-09-05: qty 6.9

## 2012-09-05 MED ORDER — CHLORTHALIDONE 25 MG PO TABS
25.0000 mg | ORAL_TABLET | Freq: Every day | ORAL | Status: DC
  Administered 2012-09-05 – 2012-09-06 (×2): 25 mg via ORAL
  Filled 2012-09-05 (×2): qty 1

## 2012-09-05 MED ORDER — DICYCLOMINE HCL 10 MG PO CAPS
10.0000 mg | ORAL_CAPSULE | Freq: Three times a day (TID) | ORAL | Status: DC
  Administered 2012-09-05 – 2012-09-06 (×3): 10 mg via ORAL
  Filled 2012-09-05 (×6): qty 1

## 2012-09-05 MED ORDER — DIVALPROEX SODIUM 500 MG PO DR TAB
500.0000 mg | DELAYED_RELEASE_TABLET | Freq: Three times a day (TID) | ORAL | Status: DC
  Administered 2012-09-05 – 2012-09-06 (×3): 500 mg via ORAL
  Filled 2012-09-05 (×4): qty 1

## 2012-09-05 MED ORDER — ASPIRIN EC 81 MG PO TBEC
81.0000 mg | DELAYED_RELEASE_TABLET | Freq: Every day | ORAL | Status: DC
  Administered 2012-09-06: 81 mg via ORAL
  Filled 2012-09-05: qty 1

## 2012-09-05 MED ORDER — MORPHINE SULFATE 4 MG/ML IJ SOLN
4.0000 mg | Freq: Once | INTRAMUSCULAR | Status: AC
  Administered 2012-09-05: 4 mg via INTRAVENOUS
  Filled 2012-09-05: qty 1

## 2012-09-05 MED ORDER — NITROGLYCERIN 0.4 MG SL SUBL: 0.4000 mg | SUBLINGUAL_TABLET | SUBLINGUAL | Status: DC | PRN

## 2012-09-05 MED ORDER — LORAZEPAM 1 MG PO TABS: 1.0000 mg | ORAL_TABLET | Freq: Every day | ORAL | Status: DC | PRN

## 2012-09-05 MED ORDER — ONDANSETRON HCL 4 MG/2ML IJ SOLN: 4.0000 mg | Freq: Four times a day (QID) | INTRAMUSCULAR | Status: DC | PRN

## 2012-09-05 MED ORDER — IOHEXOL 350 MG/ML SOLN
100.0000 mL | Freq: Once | INTRAVENOUS | Status: AC | PRN
  Administered 2012-09-05: 100 mL via INTRAVENOUS

## 2012-09-05 MED ORDER — NITROGLYCERIN 0.4 MG SL SUBL
0.4000 mg | SUBLINGUAL_TABLET | SUBLINGUAL | Status: DC | PRN
  Administered 2012-09-05 (×2): 0.4 mg via SUBLINGUAL

## 2012-09-05 MED ORDER — GI COCKTAIL ~~LOC~~: 30.0000 mL | Freq: Two times a day (BID) | ORAL | Status: DC | PRN

## 2012-09-05 MED ORDER — SODIUM CHLORIDE 0.9 % IV BOLUS (SEPSIS)
1000.0000 mL | Freq: Once | INTRAVENOUS | Status: AC
  Administered 2012-09-05: 1000 mL via INTRAVENOUS

## 2012-09-05 MED ORDER — SODIUM CHLORIDE 0.9 % IV SOLN: 250.0000 mL | INTRAVENOUS | Status: DC | PRN

## 2012-09-05 MED ORDER — ATORVASTATIN CALCIUM 20 MG PO TABS
20.0000 mg | ORAL_TABLET | Freq: Every day | ORAL | Status: DC
  Administered 2012-09-05 – 2012-09-06 (×2): 20 mg via ORAL
  Filled 2012-09-05 (×2): qty 1

## 2012-09-05 MED ORDER — SODIUM CHLORIDE 0.9 % IJ SOLN: 3.0000 mL | INTRAMUSCULAR | Status: DC | PRN

## 2012-09-05 MED ORDER — IRBESARTAN 300 MG PO TABS
300.0000 mg | ORAL_TABLET | Freq: Every day | ORAL | Status: DC
  Administered 2012-09-05 – 2012-09-06 (×2): 300 mg via ORAL
  Filled 2012-09-05 (×3): qty 1

## 2012-09-05 MED ORDER — ASPIRIN 81 MG PO CHEW
324.0000 mg | CHEWABLE_TABLET | Freq: Once | ORAL | Status: AC
  Administered 2012-09-05: 324 mg via ORAL
  Filled 2012-09-05: qty 4

## 2012-09-05 MED ORDER — CARVEDILOL 25 MG PO TABS
25.0000 mg | ORAL_TABLET | Freq: Two times a day (BID) | ORAL | Status: DC
  Administered 2012-09-06: 25 mg via ORAL
  Filled 2012-09-05 (×3): qty 1

## 2012-09-05 MED ORDER — GI COCKTAIL ~~LOC~~
30.0000 mL | Freq: Once | ORAL | Status: AC
  Administered 2012-09-05: 30 mL via ORAL
  Filled 2012-09-05: qty 30

## 2012-09-05 MED ORDER — CLOPIDOGREL BISULFATE 75 MG PO TABS
75.0000 mg | ORAL_TABLET | Freq: Every day | ORAL | Status: DC
  Administered 2012-09-05 – 2012-09-06 (×2): 75 mg via ORAL
  Filled 2012-09-05 (×2): qty 1

## 2012-09-05 MED ORDER — MORPHINE SULFATE 4 MG/ML IJ SOLN
4.0000 mg | Freq: Once | INTRAMUSCULAR | Status: DC
  Filled 2012-09-05: qty 1

## 2012-09-05 MED ORDER — ONDANSETRON HCL 4 MG/2ML IJ SOLN
4.0000 mg | Freq: Four times a day (QID) | INTRAMUSCULAR | Status: DC | PRN
  Administered 2012-09-05: 4 mg via INTRAVENOUS
  Filled 2012-09-05: qty 2

## 2012-09-05 MED ORDER — AMLODIPINE BESYLATE 10 MG PO TABS
10.0000 mg | ORAL_TABLET | Freq: Every day | ORAL | Status: DC
  Administered 2012-09-05 – 2012-09-06 (×2): 10 mg via ORAL
  Filled 2012-09-05 (×3): qty 1

## 2012-09-05 MED ORDER — PANTOPRAZOLE SODIUM 40 MG PO TBEC
40.0000 mg | DELAYED_RELEASE_TABLET | Freq: Every day | ORAL | Status: DC
  Administered 2012-09-05 – 2012-09-06 (×2): 40 mg via ORAL
  Filled 2012-09-05 (×2): qty 1

## 2012-09-05 NOTE — ED Notes (Signed)
Pt reports pain decreased to 4/10 after 2nd nitro. Refusing 3rd nitro.

## 2012-09-05 NOTE — ED Provider Notes (Signed)
History     CSN: [id number]  Arrival date & time 09/05/12  4098   First MD Initiated Contact with Patient 09/05/12 [phone]      Chief Complaint  Patient presents with  . Chest Pain    (Consider location/radiation/quality/duration/timing/severity/associated sxs/prior treatment) HPI Comments: Pt with hx of DM, CAD, s/p stent placement at [facility], HTN comes in with cc of chest pain. Pt has been having some midternal sharp chest pain, pressure like radiating to the back. The pain is constant, unprovoked and has no specific aggravating or relieving factors. Pt denies any nausea, diaphoresis - questionable DIB. When she had stent placed - she was having DIB and a different type of chest pain. Pt denies substance abuse, heavy smoking, no family hx of musculoskeletal disorder. Nitro eased pain just a little bit.  Patient is a 52 y.o. female presenting with chest pain. The history is provided by the patient.  Chest Pain Associated symptoms: fatigue   Associated symptoms: no abdominal pain, no cough, no nausea, no shortness of breath and not vomiting     Past Medical History  Diagnosis Date  . Diabetes mellitus   . Hypertension   . Seizures   . Asthma   . GERD (gastroesophageal reflux disease)     Past Surgical History  Procedure Laterality Date  . Coronary stent placement      No family history on file.  History  Substance Use Topics  . Smoking status: Never Smoker   . Smokeless tobacco: Not on file  . Alcohol Use: No    OB History   Grav Para Term Preterm Abortions TAB SAB Ect Mult Living                  Review of Systems  Constitutional: Positive for fatigue. Negative for activity change.  HENT: Negative for facial swelling and neck pain.   Respiratory: Negative for cough, shortness of breath and wheezing.   Cardiovascular: Positive for chest pain.  Gastrointestinal: Negative for nausea, vomiting, abdominal pain, diarrhea, constipation, blood in stool and abdominal  distention.  Genitourinary: Negative for hematuria and difficulty urinating.  Skin: Negative for color change.  Neurological: Negative for speech difficulty.  Hematological: Does not bruise/bleed easily.  Psychiatric/Behavioral: Negative for confusion.    Allergies  Aspirin; Dilaudid; Ultram; Valtrex; and Penicillins  Home Medications   Current Outpatient Rx  Name  Route  Sig  Dispense  Refill  . acetaminophen (TYLENOL) 500 MG tablet   Oral   Take 500 mg by mouth every 6 (six) hours as needed. For pain         . albuterol (PROVENTIL) (5 MG/ML) 0.5% nebulizer solution   Nebulization   Take 2.5 mg by nebulization every 6 (six) hours as needed for wheezing.         Marland Kitchen amitriptyline (ELAVIL) 25 MG tablet   Oral   Take 25 mg by mouth at bedtime.         Marland Kitchen amLODipine (NORVASC) 10 MG tablet   Oral   Take 10 mg by mouth daily.         Marland Kitchen atorvastatin (LIPITOR) 20 MG tablet   Oral   Take 20 mg by mouth at bedtime.          . Azilsartan Medoxomil 80 MG TABS   Oral   Take 80 mg by mouth daily. Edarbi         . baclofen (LIORESAL) 20 MG tablet   Oral   Take 20  mg by mouth 3 (three) times daily.         . budesonide-formoterol (SYMBICORT) 80-4.5 MCG/ACT inhaler   Inhalation   Inhale 2 puffs into the lungs 2 (two) times daily.         . carvedilol (COREG) 25 MG tablet   Oral   Take 25 mg by mouth 2 (two) times daily with a meal.         . chlorthalidone (HYGROTON) 25 MG tablet   Oral   Take 25 mg by mouth daily.         . clopidogrel (PLAVIX) 75 MG tablet   Oral   Take 75 mg by mouth daily.         Marland Kitchen dicyclomine (BENTYL) 10 MG capsule   Oral   Take 10 mg by mouth 4 (four) times daily -  before meals and at bedtime.         . divalproex (DEPAKOTE) 500 MG DR tablet   Oral   Take 500 mg by mouth 3 (three) times daily.         Marland Kitchen doxepin (SINEQUAN) 25 MG capsule   Oral   Take 25 mg by mouth every other day.         . esomeprazole (NEXIUM)  40 MG capsule   Oral   Take 40 mg by mouth daily before breakfast.         . LORazepam (ATIVAN) 1 MG tablet   Oral   Take 1 mg by mouth daily as needed for anxiety.         . metFORMIN (GLUCOPHAGE) 500 MG tablet   Oral   Take 500 mg by mouth 2 (two) times daily with a meal. According to cbg, for sugar         . nitroGLYCERIN (NITROSTAT) 0.4 MG SL tablet   Sublingual   Place 0.4 mg under the tongue every 5 (five) minutes as needed. For chest pain         . ranitidine (ZANTAC) 150 MG tablet   Oral   Take 150 mg by mouth daily as needed. For indigestion           BP 130/76  Pulse 72  Temp(Src) 97.8 F (36.6 C) (Oral)  Resp 15  SpO2 96%  Physical Exam  Nursing note and vitals reviewed. Constitutional: She is oriented to person, place, and time. She appears well-developed and well-nourished.  HENT:  Head: Normocephalic and atraumatic.  Eyes: EOM are normal. Pupils are equal, round, and reactive to light.  Neck: Neck supple. No JVD present.  Cardiovascular: Normal rate, regular rhythm and normal heart sounds.   No murmur heard. Pulmonary/Chest: Effort normal. No respiratory distress.  Abdominal: Soft. She exhibits no distension. There is no tenderness. There is no rebound and no guarding.  Neurological: She is alert and oriented to person, place, and time.  Skin: Skin is warm and dry.    ED Course  Procedures (including critical care time)  Labs Reviewed  CBC WITH DIFFERENTIAL - Abnormal; Notable for the following:    RBC 5.69 (*)    MCV 66.3 (*)    MCH 22.1 (*)    RDW 15.9 (*)    All other components within normal limits  POCT I-STAT, CHEM 8 - Abnormal; Notable for the following:    Glucose, Bld 102 (*)    All other components within normal limits  URINALYSIS, ROUTINE W REFLEX MICROSCOPIC  LIPASE, BLOOD  TROPONIN I  POCT I-STAT  TROPONIN I   Dg Chest Port 1 View  09/05/2012   *RADIOLOGY REPORT*  Clinical Data: Chest pain  PORTABLE CHEST - 1 VIEW   Comparison: 06/13/2012  Findings: Cardiomegaly again noted.  No pulmonary edema.  There is streaky bilateral basilar atelectasis or early infiltrate.  IMPRESSION: No pulmonary edema.  Streaky bilateral basilar atelectasis or infiltrate.   Original Report Authenticated By: Natasha Mead, M.D.     No diagnosis found.    MDM   Date: 09/05/2012  Rate: 76  Rhythm: normal sinus rhythm  QRS Axis: normal  Intervals: normal  ST/T Wave abnormalities: normal  Conduction Disutrbances: none  Narrative Interpretation: non specific ST and T wave changes  Differential diagnosis includes: ACS syndrome CHF exacerbation Myocarditis Pericarditis Pericardial effusion Pneumonia Pleural effusion Pulmonary edema PE Anemia Musculoskeletal pain  Pt comes in with cc of chest pain. Pt has CAD and in addition other risk factors for CAD. Pain is atypical for ACS syndrome - but we will get EKG and Trops. No risk factors for dissection. Will give nitro and reassess for CT.  3:31 PM CT ordered as the pain persistent. No focal neuro deficits. CT dissection - negative. Initial labs are WNL. Will request admission for ACS rule out and further risk stratification.        Derwood Kaplan, MD 09/05/12 [phone]
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
## 2012-09-05 NOTE — H&P (Signed)
Hospital Admission Note Date: 09/05/2012  Patient name: Vickie Butler Medical record number: [id number] Date of birth: May 28, 1960 Age: 52 y.o. Gender: female PCP: Pcp Not In System  Medical Service: Internal Medicine Teaching Service   Attending physician:  Dr. Criselda Peaches    1st Contact: Dr. Sherrine Maples   Pager: [phone] 2nd Contact: Dr. Everardo Beals   Pager: [phone] After 5 pm or weekends: 1st Contact:      Pager: [phone] 2nd Contact:      Pager: [phone]  Chief Complaint:   History of Present Illness: Vickie Butler is a 52 yo F with h/o CVA with residual L sided weakness, CAD (s/p stent placement in 2008), GERD and asthma who presents with chest pain.   Pt presents with acute onset, sharp, constant chest pain that began one day prior to admission. Pain radiates to the back and began while cleaning out the fridge/doing dishes. Prior to initiation of CP, she ate toast for breakfast. She tried tylenol with minimal relief. She does endorse chronic dyspnea on exertion, but no SOB currently. She has noticed increased swelling in her legs, arms and abdomen recently, and cannot sleep lying flat, sleeping on 2 pillows due to difficulty breathing.  Of note, she has not taken any of her medications for the past 2 days, b/c she lives out of town and was visiting relatives.  She has had multiple ED visits for chest pain, most recently on 06/13/12, where her chest pain was determined to be from GERD. She did have a stress test 2 years prior, but cannot remember if was normal. Prior to her stent placement in 2008, she went to ED 4x with SOB and DOE. She feels like her current pain is different from previous pain she has experienced. Currently, she feels more fatigued. She was given ASA and Nitro in the ED which did not improve her pain. She was also given a GI cocktail in the ED and has been pain free since.   She states that she had a clot in her lungs last year and was treated with Lovenox for 3weeks to 1 month by her PCP,  Odette Horns in [facility]. Dr. Melvyn Neth was the one who stopped the Lovenox. No IVC filter was placed.  She also endorses cold intolerance and thinning hair, but has never had her thyroid function checked.   She has a h/o of asthma and uses her Albuterol 3x week, last used last week. She denies any smoking history.  PCP: Odette Horns, [facility], [place]  DeMarie (neuro) - [place], [place]  Cardiologist: Gearlean Alf - [place]   Meds: Current Outpatient Rx  Name  Route  Sig  Dispense  Refill  . acetaminophen (TYLENOL) 500 MG tablet   Oral   Take 500 mg by mouth every 6 (six) hours as needed. For pain         . albuterol (PROVENTIL) (5 MG/ML) 0.5% nebulizer solution   Nebulization   Take 2.5 mg by nebulization every 6 (six) hours as needed for wheezing.         Marland Kitchen amitriptyline (ELAVIL) 25 MG tablet   Oral   Take 25 mg by mouth at bedtime.         Marland Kitchen amLODipine (NORVASC) 10 MG tablet   Oral   Take 10 mg by mouth daily.         Marland Kitchen atorvastatin (LIPITOR) 20 MG tablet   Oral   Take 20 mg by mouth at bedtime.          Marland Kitchen  Azilsartan Medoxomil 80 MG TABS   Oral   Take 80 mg by mouth daily. Edarbi         . baclofen (LIORESAL) 20 MG tablet   Oral   Take 20 mg by mouth 3 (three) times daily.         . budesonide-formoterol (SYMBICORT) 80-4.5 MCG/ACT inhaler   Inhalation   Inhale 2 puffs into the lungs 2 (two) times daily.         . carvedilol (COREG) 25 MG tablet   Oral   Take 25 mg by mouth 2 (two) times daily with a meal.         . chlorthalidone (HYGROTON) 25 MG tablet   Oral   Take 25 mg by mouth daily.         . clopidogrel (PLAVIX) 75 MG tablet   Oral   Take 75 mg by mouth daily.         Marland Kitchen dicyclomine (BENTYL) 10 MG capsule   Oral   Take 10 mg by mouth 4 (four) times daily -  before meals and at bedtime.         . divalproex (DEPAKOTE) 500 MG DR tablet   Oral   Take 500 mg by mouth 3 (three) times daily.         Marland Kitchen doxepin (SINEQUAN) 25  MG capsule   Oral   Take 25 mg by mouth every other day.         . esomeprazole (NEXIUM) 40 MG capsule   Oral   Take 40 mg by mouth daily before breakfast.         . LORazepam (ATIVAN) 1 MG tablet   Oral   Take 1 mg by mouth daily as needed for anxiety.         . metFORMIN (GLUCOPHAGE) 500 MG tablet   Oral   Take 500 mg by mouth 2 (two) times daily with a meal. According to cbg, for sugar         . nitroGLYCERIN (NITROSTAT) 0.4 MG SL tablet   Sublingual   Place 0.4 mg under the tongue every 5 (five) minutes as needed. For chest pain         . ranitidine (ZANTAC) 150 MG tablet   Oral   Take 150 mg by mouth daily as needed. For indigestion           Allergies: Allergies as of 09/05/2012 - Review Complete 09/05/2012  Allergen Reaction Noted  . Aspirin Nausea And Vomiting 10/03/2011  . Dilaudid (hydromorphone hcl)  09/05/2012  . Ultram (tramadol) Nausea And Vomiting 10/03/2011  . Valtrex (valacyclovir hcl)  10/03/2011  . Penicillins Rash 10/03/2011   Past Medical History  Diagnosis Date  . Diabetes mellitus   . Hypertension   . Seizures   . Asthma   . GERD (gastroesophageal reflux disease)    Past Surgical History  Procedure Laterality Date  . Coronary stent placement     No family history on file. History   Social History  . Marital Status: Single    Spouse Name: N/A    Number of Children: N/A  . Years of Education: N/A   Occupational History  . Not on file.   Social History Main Topics  . Smoking status: Never Smoker   . Smokeless tobacco: Not on file  . Alcohol Use: No  . Drug Use: No  . Sexually Active: Not on file   Other Topics Concern  . Not on file  Social History Narrative  . No narrative on file    Review of Systems: A 10 point ROS was performed; pertinent positives and negatives were noted in the HPI  Physical Exam: Blood pressure 133/87, pulse 69, temperature 97.8 F (36.6 C), temperature source Oral, resp. rate 16,  SpO2 96.00%. General: Oriented, well-developed, and cooperative on examination.  Head: Normocephalic and atraumatic.  Eyes: Pupils equal, round, and reactive to light, no injection and anicteric.  Mouth: Pharynx pink and moist Neck: Supple, full ROM, no thyromegaly, no JVD, and no carotid bruits.  Chest: Pain reproducible on palpation, but per pt not as severe. Lungs: CTAB, normal respiratory effort, no accessory muscle use, no crackles, and no wheezes. Heart: Regular rate, regular rhythm, no murmur, no gallop, and no rub.  Abdomen: Soft, non-tender, non-distended, normal bowel sounds, no guarding, no rebound tenderness, no organomegaly.  Msk: No joint swelling, warmth, or erythema.  Extremities: 2+ radial and DP pulses bilaterally. 1+ BLE edema. Neurologic: Alert & oriented X3, cranial nerves II-XII intact, diminished strength in BU &LE, 3/5 Skin: Turgor normal and no rashes.  Psych: Poor medical historian, appears somewhat sedated.  Lab results: Basic Metabolic Panel:  Recent Labs  16/10/96 1042  NA 140  K 4.3  CL 101  GLUCOSE 102*  BUN 15  CREATININE 0.80    Recent Labs  09/05/12 1106  LIPASE 45    CBC:  Recent Labs  09/05/12 0909 09/05/12 1042  WBC 7.0  --   NEUTROABS 3.6  --   HGB 12.6 14.6  HCT 37.7 43.0  MCV 66.3*  --   PLT 205  --    CBG:  Recent Labs  09/05/12 1340  GLUCAP 89   Urinalysis:  Recent Labs  09/05/12 1208  COLORURINE YELLOW  LABSPEC 1.019  PHURINE 6.0  GLUCOSEU NEGATIVE  HGBUR NEGATIVE  BILIRUBINUR NEGATIVE  KETONESUR NEGATIVE  PROTEINUR NEGATIVE  UROBILINOGEN 0.2  NITRITE NEGATIVE  LEUKOCYTESUR NEGATIVE    Imaging results:  Dg Chest Port 1 View  09/05/2012   *RADIOLOGY REPORT*  Clinical Data: Chest pain  PORTABLE CHEST - 1 VIEW  Comparison: 06/13/2012  Findings: Cardiomegaly again noted.  No pulmonary edema.  There is streaky bilateral basilar atelectasis or early infiltrate.  IMPRESSION: No pulmonary edema.  Streaky  bilateral basilar atelectasis or infiltrate.   Original Report Authenticated By: Natasha Mead, M.D.   Ct Angio Chest Aortic Dissect W &/or W/o  09/05/2012   *RADIOLOGY REPORT*  Clinical Data: Chest pain radiating to back, shortness of breath, history coronary artery disease post stenting, question aortic dissection  CT ANGIOGRAPHY CHEST  Technique:  Multidetector CT imaging of the chest using the standard protocol during bolus administration of intravenous contrast. Multiplanar reconstructed images including MIPs were obtained and reviewed to evaluate the vascular anatomy.  Contrast: OMNIPAQUE IOHEXOL 350 MG/ML SOLN  Comparison: None  Findings: No intramural hematoma or periaortic hemorrhage on precontrast images. At least to right thyroid nodules largest 1.9 x 1.9 x 1.2 cm. Coronary arterial stent. Aorta normal caliber without aneurysm or dissection. Pulmonary arteries grossly patent without evidence of pulmonary embolism. No definite thoracic adenopathy. Inhomogeneous appearing nephrograms at the upper poles of both kidneys, suspect related to the beam hardening artifacts image 124.  Subsegmental atelectasis bilateral lower lobes. Generally low lung volumes. No definite infiltrate, pleural effusion or pneumothorax. No acute osseous findings.  IMPRESSION: No evidence of aortic aneurysm, aortic dissection or pulmonary embolism. Subsegmental atelectasis bilateral lower lobes. Likely artifacts at the kidneys.  Right thyroid masses; recommend follow-up non emergent thyroid ultrasound.   Original Report Authenticated By: Ulyses Southward, M.D.    Other results: EKG: Normal sinus rhythm, rate 76, right axis deviation, QTc 418. Nonspecific T-wave changes. Unchanged from previous.  Assessment & Plan by Problem:  Chest pain: H/o multiple ED visits 2/2 chest pain, most recently 2/14 where she was treated for GERD. Her pain is somewhat reproducible on exam, so this could have a MSK component. With her pain radiating  into her back, this raises concern for aortic dissection or possibly a PE. A CTA was ordered in the ED and was negative for aortic dissection and PE, so these can be ruled out. She does have a h/o of CAD with stent placement in 2008, so given her histroy, this could be ACS. Her EKG is unchanged from previous. Troponin negative x2. She does have 1+ pitting BLE edema, which she states is worsening, and she does have 2 pillow orthopnea and no h/o of and ECHO, so will have to r/o CHF as cause of her sx. In the ED, her pain did resolve after a GI cocktail, and given her previous history of GERD related chest pain, this episode could also be GERD related. - Admit to IMTS to tele - Strict I&Os - Daily weights - 2D ECHO - Troponin q6h x3 - Nitro PRN - ASA 81mg  po daily - Coreg 25mg  po BID, Norvasc 10mg  po daily, irbesartan 300mg  po daily, Chlorthalidone 25mg  po daily - Continuing Plavix 75mg  po daily  CAD: H/o stent placed in 2008 at [facility]. She is on Plavix daily and is seen by Gearlean Alf, her Cardiologist, but does not seem to have seen him lately. She is on an ARB, CCB, BB, thyazide, Statin, and has Nitro PRN, and does endorse medication compliance. No meds in 2 days b/c ran out- pt does not live here in [place].  - Continue ARB, CCB, BB, thiazide, and statin  HTN: BP elevated but stable on admission. She does have a BB, CCB, diuretic, and an ARB that she normally takes at home. However, she has been out of these medications for 1-2 days. Will restart home meds. - Coreg 25mg  po BID - Norvasc 10mg  po daily - Irbesartan in place of her home azilsartan - Chlorthalidone 25mg  po daily  DM II: On Metformin at home, which was held on admission.  Asthma: Per pt, h/o of asthma. She takes Symbicort at home and albuterol PRN. She usually uses her Albuterol 2x week.   GERD: H/o GERD, on Nexium at home. Endorses some reflux-like symptoms yesterday. In the ED, pain relieved with GI cocktail.  -  Starting Protonix in place of Nexium - GI cocktail BID PRN  Seizure disorder: Pt unsure what type of seizures she has, but she takes Depakote regularly. No meds in 2-3 days. - Depakote 500mg  po TID - Checking Depakote level  IBS: Per pt, h/o of irritable bowel syndrome, for which she takes Bentyl, which we will continue on admission.  DVT PPx: Lovenox and SCDs  Dispo: Disposition is deferred at this time, awaiting improvement of current medical problems. Anticipated discharge in approximately 1-2 day(s).   The patient does not have a current PCP (Pcp Not In System), therefore will not be requiring OPC follow-up after discharge.   The patient does not have transportation limitations that hinder transportation to clinic appointments.  Signed: Genelle Gather 09/05/2012, 3:36 PM

## 2012-09-05 NOTE — H&P (Signed)
[facility] Admission Note Date: 09/05/2012  Patient name: Vickie Butler Medical record number: [id number] Date of birth: 10-Sep-1960 Age: 52 y.o. Gender: female PCP: Pcp Not In System  Medical Service: Internal Medicine (B1)  Attending physician: Dr. Criselda Peaches     Chief Complaint: chest pain  History of Present Illness: Vickie Butler is a 52 yo F with a h/o CAD s/p stent in 2008, CVA x 2, HTN, DM, asthma, seizures, h/o PE, and IBS presenting with substernal chest pain radiating to back of approximately 24 hours duration. She reports the pain started at approximately 1:30 pm yesterday, while she was washing dishes and cleaning out the fridge.  She had only eaten a piece of toast and denies any muscle strain or movement at the time of onset. The pain persisted overnight so she came to the ED for evaluation this AM.  She also endorses a HA, nausea, SOB, orthopnea, PND, tremor at rest, hair/skin/nail changes, and temperature intolerance.  She took APAP with minimal improvement.  In 2008, she had severe SOB and a cardiac stent was subsequently placed at [facility].  She reports that her chest pain and SOB today are different than that presentation. She has a history of frequent ED visits for chest pain that have been attributed to GERD, though we do not have any of those records. She states her last normal stress test was in 2010.  She had a CTA after being evaluated in the ED which did not any evidence of aortic aneurysm, aortic dissection or pulmonary embolism.  There was subsegmental atelectasis bilateral lower lobes as well as a R thyroid mass.  Her last seizure was in January of this year and she reports that the start with hand jerking movement.  She has never been intubated for her asthma but did require a long ICU stay recently when she had PNA.  She states that she took lovenox for 3 weeks after her PE in 2013.     Meds: Current Outpatient Rx  Name  Route  Sig  Dispense  Refill  .  acetaminophen (TYLENOL) 500 MG tablet   Oral   Take 500 mg by mouth every 6 (six) hours as needed. For pain         . albuterol (PROVENTIL) (5 MG/ML) 0.5% nebulizer solution   Nebulization   Take 2.5 mg by nebulization every 6 (six) hours as needed for wheezing.         Marland Kitchen amitriptyline (ELAVIL) 25 MG tablet   Oral   Take 25 mg by mouth at bedtime.         Marland Kitchen amLODipine (NORVASC) 10 MG tablet   Oral   Take 10 mg by mouth daily.         Marland Kitchen atorvastatin (LIPITOR) 20 MG tablet   Oral   Take 20 mg by mouth at bedtime.          . Azilsartan Medoxomil 80 MG TABS   Oral   Take 80 mg by mouth daily. Edarbi         . baclofen (LIORESAL) 20 MG tablet   Oral   Take 20 mg by mouth 3 (three) times daily.         . budesonide-formoterol (SYMBICORT) 80-4.5 MCG/ACT inhaler   Inhalation   Inhale 2 puffs into the lungs 2 (two) times daily.         . carvedilol (COREG) 25 MG tablet   Oral   Take 25 mg by mouth 2 (  two) times daily with a meal.         . chlorthalidone (HYGROTON) 25 MG tablet   Oral   Take 25 mg by mouth daily.         . clopidogrel (PLAVIX) 75 MG tablet   Oral   Take 75 mg by mouth daily.         Marland Kitchen dicyclomine (BENTYL) 10 MG capsule   Oral   Take 10 mg by mouth 4 (four) times daily -  before meals and at bedtime.         . divalproex (DEPAKOTE) 500 MG DR tablet   Oral   Take 500 mg by mouth 3 (three) times daily.         Marland Kitchen doxepin (SINEQUAN) 25 MG capsule   Oral   Take 25 mg by mouth every other day.         . esomeprazole (NEXIUM) 40 MG capsule   Oral   Take 40 mg by mouth daily before breakfast.         . LORazepam (ATIVAN) 1 MG tablet   Oral   Take 1 mg by mouth daily as needed for anxiety.         . metFORMIN (GLUCOPHAGE) 500 MG tablet   Oral   Take 500 mg by mouth 2 (two) times daily with a meal. According to cbg, for sugar         . nitroGLYCERIN (NITROSTAT) 0.4 MG SL tablet   Sublingual   Place 0.4 mg under the  tongue every 5 (five) minutes as needed. For chest pain         . ranitidine (ZANTAC) 150 MG tablet   Oral   Take 150 mg by mouth daily as needed. For indigestion           Allergies: Allergies as of 09/05/2012 - Review Complete 09/05/2012  Allergen Reaction Noted  . Aspirin Nausea And Vomiting 10/03/2011  . Dilaudid (hydromorphone hcl)  09/05/2012  . Ultram (tramadol) Nausea And Vomiting 10/03/2011  . Valtrex (valacyclovir hcl)  10/03/2011  . Penicillins Rash 10/03/2011   Past Medical History  Diagnosis Date  . Diabetes mellitus   . Hypertension   . Seizures   . Asthma   . GERD (gastroesophageal reflux disease)   . CVA (cerebral infarction)     x2, affected speech the first time, left side strength the 2nd time   Past Surgical History  Procedure Laterality Date  . Coronary stent placement  2008  . Partial hysterectomy     Family History  Problem Relation Age of Onset  . Heart disease Maternal Grandfather    History   Social History  . Marital Status: Single    Spouse Name: N/A    Number of Children: N/A  . Years of Education: N/A   Occupational History  . Not on file.   Social History Main Topics  . Smoking status: Never Smoker   . Smokeless tobacco: Not on file  . Alcohol Use: No  . Drug Use: No  . Sexually Active: Not on file   Other Topics Concern  . Not on file   Social History Narrative  . No narrative on file   SoHx: lives in [place], [place] with ex-husband. 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  FamHx: MGF CAD  Review of Systems: A comprehensive review of systems was negative except per HPI and for: Cardiovascular: positive for orthopnea and paroxysmal nocturnal dyspnea Gastrointestinal: positive for blood in  stool Endocrine: positive for temperature intolerance and chagnes in skin/hair/nails and hand tremor.  Physical Exam: Blood pressure 133/87, pulse 69, temperature 97.8 F (36.6 C), temperature source Oral, resp. rate 16, SpO2  96.00%. General appearance: alert, cooperative and no distress Head: Normocephalic, without obvious abnormality, atraumatic Eyes: EOMI.  Sclera nonicteric. Throat: lips, mucosa, and tongue normal; teeth and gums normal Neck: no adenopathy, no carotid bruit, no JVD, supple, symmetrical, trachea midline, thyroid not enlarged, symmetric, no tenderness/mass/nodules and tender to palpation on R neck. Lungs: clear to auscultation bilaterally Chest: pain to palpation in substernal area; pain produced is not identical to other chest pain Heart: regular rate and rhythm, S1, S2 normal, no murmur, click, rub or gallop Abdomen: soft, non-tender; bowel sounds normal; no masses,  no organomegaly Extremities: edema 1+ in BLE; no cyanosis or clubbing Pulses: 2+ and symmetric Skin: Skin color, texture, turgor normal. No rashes or lesions Neurologic: Motor: 5/5. CN II-XII grossly intact.   Lab results: Basic Metabolic Panel:  Recent Labs  16/10/96 1042  NA 140  K 4.3  CL 101  GLUCOSE 102*  BUN 15  CREATININE 0.80   Liver Function Tests: No results found for this basename: AST, ALT, ALKPHOS, BILITOT, PROT, ALBUMIN,  in the last 72 hours  Recent Labs  09/05/12 1106  LIPASE 45   No results found for this basename: AMMONIA,  in the last 72 hours CBC:  Recent Labs  09/05/12 0909 09/05/12 1042  WBC 7.0  --   NEUTROABS 3.6  --   HGB 12.6 14.6  HCT 37.7 43.0  MCV 66.3*  --   PLT 205  --    Cardiac Enzymes:  Recent Labs  09/05/12 1437  TROPONINI <0.30   BNP: No results found for this basename: PROBNP,  in the last 72 hours D-Dimer: No results found for this basename: DDIMER,  in the last 72 hours CBG:  Recent Labs  09/05/12 1340  GLUCAP 89   Hemoglobin A1C: No results found for this basename: HGBA1C,  in the last 72 hours Fasting Lipid Panel: No results found for this basename: CHOL, HDL, LDLCALC, TRIG, CHOLHDL, LDLDIRECT,  in the last 72 hours Thyroid Function Tests: No  results found for this basename: TSH, T4TOTAL, FREET4, T3FREE, THYROIDAB,  in the last 72 hours Anemia Panel: No results found for this basename: VITAMINB12, FOLATE, FERRITIN, TIBC, IRON, RETICCTPCT,  in the last 72 hours Coagulation: No results found for this basename: LABPROT, INR,  in the last 72 hours Urine Drug Screen: Drugs of Abuse  No results found for this basename: labopia, cocainscrnur, labbenz, amphetmu, thcu, labbarb    Alcohol Level: No results found for this basename: ETH,  in the last 72 hours Urinalysis:  Recent Labs  09/05/12 1208  COLORURINE YELLOW  LABSPEC 1.019  PHURINE 6.0  GLUCOSEU NEGATIVE  HGBUR NEGATIVE  BILIRUBINUR NEGATIVE  KETONESUR NEGATIVE  PROTEINUR NEGATIVE  UROBILINOGEN 0.2  NITRITE NEGATIVE  LEUKOCYTESUR NEGATIVE    Imaging results:  Dg Chest Port 1 View  09/05/2012   *RADIOLOGY REPORT*  Clinical Data: Chest pain  PORTABLE CHEST - 1 VIEW  Comparison: 06/13/2012  Findings: Cardiomegaly again noted.  No pulmonary edema.  There is streaky bilateral basilar atelectasis or early infiltrate.  IMPRESSION: No pulmonary edema.  Streaky bilateral basilar atelectasis or infiltrate.   Original Report Authenticated By: Natasha Mead, M.D.   Ct Angio Chest Aortic Dissect W &/or W/o  09/05/2012   *RADIOLOGY REPORT*  Clinical Data: Chest pain radiating  to back, shortness of breath, history coronary artery disease post stenting, question aortic dissection  CT ANGIOGRAPHY CHEST  Technique:  Multidetector CT imaging of the chest using the standard protocol during bolus administration of intravenous contrast. Multiplanar reconstructed images including MIPs were obtained and reviewed to evaluate the vascular anatomy.  Contrast: OMNIPAQUE IOHEXOL 350 MG/ML SOLN  Comparison: None  Findings: No intramural hematoma or periaortic hemorrhage on precontrast images. At least to right thyroid nodules largest 1.9 x 1.9 x 1.2 cm. Coronary arterial stent. Aorta normal caliber  without aneurysm or dissection. Pulmonary arteries grossly patent without evidence of pulmonary embolism. No definite thoracic adenopathy. Inhomogeneous appearing nephrograms at the upper poles of both kidneys, suspect related to the beam hardening artifacts image 124.  Subsegmental atelectasis bilateral lower lobes. Generally low lung volumes. No definite infiltrate, pleural effusion or pneumothorax. No acute osseous findings.  IMPRESSION: No evidence of aortic aneurysm, aortic dissection or pulmonary embolism. Subsegmental atelectasis bilateral lower lobes. Likely artifacts at the kidneys. Right thyroid masses; recommend follow-up non emergent thyroid ultrasound.   Original Report Authenticated By: Ulyses Southward, M.D.    Other results: EKG: NSR, nonspecific ST and T wave abnormality   Assessment & Plan by Problem: 53 yo F with a h/o CAD s/p stent in 2008, CVA x 2, HTN, DM, asthma, seizures, PE, and IBS, with substernal chest pain.  Active Problems:   * No active hospital problems. *  Chest Pain -Etiology unclear. Likely related to GERD, thyroid disease, or new onset heart failure. ACS remains a possibility, though 1st troponin level wnl. -Echo with contrast in AM. -Obtain TSH level and lipid panel. -Cycle tropoinin levels x 3.  -Daily weights. Strict Is/Os. -APAP 650 mg q4h prn pain. -Cardiac monitoring.  -BMP in AM.   CAD -ASA 81 mg; zofran 4 mg IV for nausea associated with ASA administration. -Continue home atorvastatin 20 mg daily, carvedilol 25 mg, and plavix 75 mg  -Irbesartan 300 mg daily.  -Nitrostat 0.4mg  q41m prn x3. -Heart healthy diet. Medlocked.  GERD -Pain improved with administration of GI cocktail in ED, suggesting GERD may be origin of chest pain. -GI cocktail BID. -Protonix 40 mg daily.  HTN -HDS. -Continue home amlodipine 10 mg daily, chlorthalidone 25 mg   DM -Stable. -POCT CBG monitoring.  -Obtain HgA1C. -Holding home metformin.   Asthma -Stable on RA.   -Continue home symbicort and albuterol   Seizures -Obtain depakote level. -Depakote 500 mg TID  IBS -Stable. -Continue home amitriptyline 25 mg daily, bentyl 10 mg TID. -Holding home doxepin.  Chronic Back Pain  -Stable -Continue home baclofen.  Anxiety -Stable -Continue home ativan 1 mg prn daily.  Access: L hand PIV and R AC PIV (NS 5/19)  Code: full  Prophylaxis: lovenox  Dispo: floor status -Anticipated date of discharge: tomorrow   This is a Psychologist, occupational Note.  The care of the patient was discussed with Dr. Dorma Russell and the assessment and plan was formulated with their assistance.  Please see their note for official documentation of the patient encounter.   Signed: Derwood Kaplan 09/05/2012, 4:33 PM

## 2012-09-05 NOTE — ED Notes (Signed)
Pt reports sharp pain to chest since last night. Reports shortness of breath and nausea. Pain radiates in to back. Pt on plavix.

## 2012-09-05 NOTE — ED Notes (Signed)
Pt pain free at this time and admitting md okayed to hold morphine at this time

## 2012-09-05 NOTE — ED Notes (Signed)
Attempted report 

## 2012-09-05 NOTE — ED Notes (Signed)
Patient transported to CT 

## 2012-09-06 ENCOUNTER — Encounter (HOSPITAL_COMMUNITY): Payer: Self-pay | Admitting: Internal Medicine

## 2012-09-06 DIAGNOSIS — I209 Angina pectoris, unspecified: Secondary | ICD-10-CM

## 2012-09-06 DIAGNOSIS — I369 Nonrheumatic tricuspid valve disorder, unspecified: Secondary | ICD-10-CM

## 2012-09-06 LAB — GLUCOSE, CAPILLARY: Glucose-Capillary: 131 mg/dL — ABNORMAL HIGH (ref 70–99)

## 2012-09-06 LAB — TROPONIN I: Troponin I: <0.3 ng/mL (ref <0.30)

## 2012-09-06 LAB — LIPID PANEL
HDL: 58 mg/dL (ref >39)
LDL Cholesterol: 29 mg/dL (ref 0–99)
Total CHOL/HDL Ratio: 1.7 RATIO

## 2012-09-06 LAB — TSH: TSH: 1.629 u[IU]/mL (ref 0.350–4.500)

## 2012-09-06 LAB — HEMOGLOBIN A1C: Mean Plasma Glucose: 134 mg/dL — ABNORMAL HIGH (ref <117)

## 2012-09-06 MED ORDER — POTASSIUM CHLORIDE CRYS ER 20 MEQ PO TBCR
40.0000 meq | EXTENDED_RELEASE_TABLET | Freq: Once | ORAL | Status: AC
  Administered 2012-09-06: 40 meq via ORAL
  Filled 2012-09-06: qty 2

## 2012-09-06 NOTE — Progress Notes (Signed)
Subjective: Pt states that she is feeling much better today. Still with mild chest pain, today located at her left breast, but with movement it changed over to her right chest/breast.   Objective: Vital signs in last 24 hours: Filed Vitals:   09/05/12 2031 09/05/12 2303 09/06/12 0516 09/06/12 0812  BP: 142/90  123/63   Pulse: 75 68 70   Temp: 97.8 F (36.6 C)  98.1 F (36.7 C)   TempSrc: Oral  Oral   Resp:  16    Height: 5\' 5"  (1.651 m)     Weight: 213 lb 8 oz (96.843 kg)     SpO2: 99% 97% 95% 96%   Weight change:  No intake or output data in the 24 hours ending 09/06/12 0954 Vitals reviewed. General: resting in bed, NAD HEENT: PERRL, EOMI, no scleral icterus Chest: Pain not reproducible on exam Cardiac: RRR, no rubs, murmurs or gallops Pulm: clear to auscultation bilaterally, no wheezes, rales, or rhonchi Abd: soft, nontender, nondistended, BS present Ext: warm and well perfused, 1+ pitting edema Neuro: Alert and oriented X3, nonfocal  Lab Results: Basic Metabolic Panel:  Recent Labs Lab 09/05/12 1042 09/05/12 2219  NA 140 136  K 4.3 3.2*  CL 101 98  CO2  --  26  GLUCOSE 102* 194*  BUN 15 9  CREATININE 0.80 0.80  CALCIUM  --  8.9    Recent Labs Lab 09/05/12 1106  LIPASE 45   CBC:  Recent Labs Lab 09/05/12 0909 09/05/12 1042  WBC 7.0  --   NEUTROABS 3.6  --   HGB 12.6 14.6  HCT 37.7 43.0  MCV 66.3*  --   PLT 205  --    Cardiac Enzymes:  Recent Labs Lab 09/05/12 1437 09/05/12 2219 09/06/12 0456  TROPONINI <0.30 <0.30 <0.30   CBG:  Recent Labs Lab 09/05/12 1340 09/05/12 2047 09/06/12 0726  GLUCAP 89 71 131*   Fasting Lipid Panel:  Recent Labs Lab 09/06/12 0456  CHOL 101  HDL 58  LDLCALC 29  TRIG 69  CHOLHDL 1.7   Thyroid Function Tests: No results found for this basename: TSH, T4TOTAL, FREET4, T3FREE, THYROIDAB,  in the last 168 hours  Urinalysis:  Recent Labs Lab 09/05/12 1208  COLORURINE YELLOW  LABSPEC 1.019   PHURINE 6.0  GLUCOSEU NEGATIVE  HGBUR NEGATIVE  BILIRUBINUR NEGATIVE  KETONESUR NEGATIVE  PROTEINUR NEGATIVE  UROBILINOGEN 0.2  NITRITE NEGATIVE  LEUKOCYTESUR NEGATIVE   Studies/Results: Dg Chest Port 1 View  09/05/2012   *RADIOLOGY REPORT*  Clinical Data: Chest pain  PORTABLE CHEST - 1 VIEW  Comparison: 06/13/2012  Findings: Cardiomegaly again noted.  No pulmonary edema.  There is streaky bilateral basilar atelectasis or early infiltrate.  IMPRESSION: No pulmonary edema.  Streaky bilateral basilar atelectasis or infiltrate.   Original Report Authenticated By: Natasha Mead, M.D.   Ct Angio Chest Aortic Dissect W &/or W/o  09/05/2012   *RADIOLOGY REPORT*  Clinical Data: Chest pain radiating to back, shortness of breath, history coronary artery disease post stenting, question aortic dissection  CT ANGIOGRAPHY CHEST  Technique:  Multidetector CT imaging of the chest using the standard protocol during bolus administration of intravenous contrast. Multiplanar reconstructed images including MIPs were obtained and reviewed to evaluate the vascular anatomy.  Contrast: OMNIPAQUE IOHEXOL 350 MG/ML SOLN  Comparison: None  Findings: No intramural hematoma or periaortic hemorrhage on precontrast images. At least to right thyroid nodules largest 1.9 x 1.9 x 1.2 cm. Coronary arterial stent. Aorta normal caliber  without aneurysm or dissection. Pulmonary arteries grossly patent without evidence of pulmonary embolism. No definite thoracic adenopathy. Inhomogeneous appearing nephrograms at the upper poles of both kidneys, suspect related to the beam hardening artifacts image 124.  Subsegmental atelectasis bilateral lower lobes. Generally low lung volumes. No definite infiltrate, pleural effusion or pneumothorax. No acute osseous findings.  IMPRESSION: No evidence of aortic aneurysm, aortic dissection or pulmonary embolism. Subsegmental atelectasis bilateral lower lobes. Likely artifacts at the kidneys. Right  thyroid masses; recommend follow-up non emergent thyroid ultrasound.   Original Report Authenticated By: Ulyses Southward, M.D.   Medications: I have reviewed the patient's current medications. Scheduled Meds: . amLODipine  10 mg Oral Daily  . aspirin EC  81 mg Oral Daily  . atorvastatin  20 mg Oral QHS  . budesonide-formoterol  2 puff Inhalation BID  . carvedilol  25 mg Oral BID WC  . chlorthalidone  25 mg Oral Daily  . clopidogrel  75 mg Oral Daily  . dicyclomine  10 mg Oral TID AC & HS  . divalproex  500 mg Oral TID  . enoxaparin (LOVENOX) injection  40 mg Subcutaneous Q24H  . irbesartan  300 mg Oral Daily  . pantoprazole  40 mg Oral Daily  . potassium chloride  40 mEq Oral Once  . sodium chloride  3 mL Intravenous Q12H   Continuous Infusions:  PRN Meds:.sodium chloride, acetaminophen, albuterol, gi cocktail, LORazepam, nitroGLYCERIN, ondansetron (ZOFRAN) IV, sodium chloride  Assessment/Plan: Vickie Butler is a 52 yo F with h/o CVA with residual L sided weakness, CAD (s/p stent placement in 2008), GERD and asthma who presents with chest pain.   Atypical chest pain:  H/o multiple ED visits 2/2 chest pain, most recently 2/14 where she was treated for GERD. Her pain is somewhat reproducible on exam, so this could have a MSK component. With her pain radiating into her back, this raises concern for aortic dissection or possibly a PE. A CTA was ordered in the ED and was negative for aortic dissection and PE, so these can be ruled out. She does have a h/o of CAD with stent placement in 2008, so given her histroy, this could be ACS. Her EKG is unchanged from previous and troponin negative x4. She does have 1+ pitting BLE edema, which she states is worsening, and she does have 2 pillow orthopnea and no h/o of and ECHO, so will have to r/o CHF as cause of her sx. Getting @D  ECHO while in the hospital. In the ED, her pain did resolve after a GI cocktail, and given her previous history of GERD related chest  pain, this episode could also be GERD related. Also checking TSH, as sx could be realted to hypothyroidism. - 2D ECHO   - F/u TSH - Nitro PRN  - ASA 81mg  po daily  - Coreg 25mg  po BID, Norvasc 10mg  po daily, irbesartan 300mg  po daily, Chlorthalidone 25mg  po daily  - Continuing Plavix 75mg  po daily   CAD:  H/o stent placed in 2008 at [facility]. She is on Plavix daily and is seen by Gearlean Alf, her Cardiologist, but does not seem to have seen him lately. She is on an ARB, CCB, BB, thyazide, Statin, and has Nitro PRN, and does endorse medication compliance. No meds in 2 days b/c ran out- pt does not live here in [place].  - Continue ARB, CCB, BB, thiazide, and statin   HTN:  Stable. BP elevated but stable since admission. She does have a BB, CCB,  diuretic, and an ARB that she normally takes at home. However, she has been out of these medications for 1-2 days.  Restarted home meds on admission.  - Coreg 25mg  po BID  - Norvasc 10mg  po daily  - Irbesartan in place of her home azilsartan  - Chlorthalidone 25mg  po daily   Hypokalemia:  Potassium 4.3 in the ED, but on recheck, it was down to 3.2, possibly dilutional. Replacing with po KCl.   DM II:  CBGs controlled. On Metformin at home, which was held on admission.   Asthma:  Stable. Per pt, h/o of asthma. She takes Symbicort at home and albuterol PRN. She usually uses her Albuterol 2x week.   GERD:  H/o GERD, on Nexium at home. Endorses some reflux-like symptoms yesterday. In the ED, pain relieved with GI cocktail.  - Starting Protonix in place of Nexium  - GI cocktail BID PRN   Seizure disorder:  Stable. No recent seizures. Pt unsure what type of seizures she has, but she takes Depakote regularly. No meds in 2-3 days prior to admission, Depakote level subtherapeutic.  - Depakote 500mg  po TID   IBS:  Per pt, h/o of irritable bowel syndrome, for which she takes Bentyl, which we will continue on admission.   DVT PPx:  Lovenox  and SCDs   Dispo: Anticipated discharge in approximately 0-1 day(s), pending ECHO results. She will need close hospital follow up with her PCP after discharge    The patient does have a current PCP (Pcp Not In System), therefore will not be requiring OPC follow-up after discharge.   The patient does not have transportation limitations that hinder transportation to clinic appointments.  .Services Needed at time of discharge: Y = Yes, Blank = No PT:   OT:   RN:   Equipment:   Other:     LOS: 1 day   Genelle Gather 09/06/2012, 9:54 AM

## 2012-09-06 NOTE — Discharge Summary (Signed)
Internal Medicine Teaching [facility] Discharge Note  Name: Vickie Butler MRN: [id number] DOB: [birth year] 52 y.o.  Date of Admission: 09/05/2012  8:28 AM Date of Discharge: 09/06/2012 Attending Physician: Inez Catalina, MD  Discharge Diagnosis: Principal Problem:   Atypical chest pain Active Problems:   CAD (coronary artery disease)   Diabetes mellitus   Hypertension   Seizures   Asthma   GERD (gastroesophageal reflux disease)   Discharge Medications:   Medication List    ASK your doctor about these medications       acetaminophen 500 MG tablet  Commonly known as:  TYLENOL  Take 500 mg by mouth every 6 (six) hours as needed. For pain     albuterol (5 MG/ML) 0.5% nebulizer solution  Commonly known as:  PROVENTIL  Take 2.5 mg by nebulization every 6 (six) hours as needed for wheezing.     amitriptyline 25 MG tablet  Commonly known as:  ELAVIL  Take 25 mg by mouth at bedtime.     amLODipine 10 MG tablet  Commonly known as:  NORVASC  Take 10 mg by mouth daily.     atorvastatin 20 MG tablet  Commonly known as:  LIPITOR  Take 20 mg by mouth at bedtime.     Azilsartan Medoxomil 80 MG Tabs  Take 80 mg by mouth daily. Edarbi     baclofen 20 MG tablet  Commonly known as:  LIORESAL  Take 20 mg by mouth 3 (three) times daily.     budesonide-formoterol 80-4.5 MCG/ACT inhaler  Commonly known as:  SYMBICORT  Inhale 2 puffs into the lungs 2 (two) times daily.     carvedilol 25 MG tablet  Commonly known as:  COREG  Take 25 mg by mouth 2 (two) times daily with a meal.     chlorthalidone 25 MG tablet  Commonly known as:  HYGROTON  Take 25 mg by mouth daily.     clopidogrel 75 MG tablet  Commonly known as:  PLAVIX  Take 75 mg by mouth daily.     dicyclomine 10 MG capsule  Commonly known as:  BENTYL  Take 10 mg by mouth 4 (four) times daily -  before meals and at bedtime.     divalproex 500 MG DR tablet  Commonly known as:  DEPAKOTE  Take 500 mg by mouth 3  (three) times daily.     doxepin 25 MG capsule  Commonly known as:  SINEQUAN  Take 25 mg by mouth every other day.     esomeprazole 40 MG capsule  Commonly known as:  NEXIUM  Take 40 mg by mouth daily before breakfast.     LORazepam 1 MG tablet  Commonly known as:  ATIVAN  Take 1 mg by mouth daily as needed for anxiety.     metFORMIN 500 MG tablet  Commonly known as:  GLUCOPHAGE  Take 500 mg by mouth 2 (two) times daily with a meal. According to cbg, for sugar     nitroGLYCERIN 0.4 MG SL tablet  Commonly known as:  NITROSTAT  Place 0.4 mg under the tongue every 5 (five) minutes as needed. For chest pain     ranitidine 150 MG tablet  Commonly known as:  ZANTAC  Take 150 mg by mouth daily as needed. For indigestion        Disposition and follow-up:   Vickie Butler was discharged from [facility] in Stable condition.  At the hospital follow up visit please address  -  Her blood pressure control: the Amlodipine was held at discharge due to hypotension. Unsure how compliant she has been with her medications at home. - Please check a BMP to evaluate her potassium. It was low on admission but was replaced.  - She is taking 2 TCAs, unsure if this is accurate, please address with the patient if this is not what she is supposed to be taking.  Follow-up Appointments:     Follow-up Information   Follow up with Pcp Not In System On 09/13/2012. (3:45 PM)    Contact information:   [facility] Dr. Melvyn Neth [phone]      Consultations:    Procedures Performed:  Dg Chest Port 1 View  09/05/2012   *RADIOLOGY REPORT*  Clinical Data: Chest pain  PORTABLE CHEST - 1 VIEW  Comparison: 06/13/2012  Findings: Cardiomegaly again noted.  No pulmonary edema.  There is streaky bilateral basilar atelectasis or early infiltrate.  IMPRESSION: No pulmonary edema.  Streaky bilateral basilar atelectasis or infiltrate.   Original Report Authenticated By: Natasha Mead, M.D.    Ct Angio Chest Aortic Dissect W &/or W/o  09/05/2012   *RADIOLOGY REPORT*  Clinical Data: Chest pain radiating to back, shortness of breath, history coronary artery disease post stenting, question aortic dissection  CT ANGIOGRAPHY CHEST  Technique:  Multidetector CT imaging of the chest using the standard protocol during bolus administration of intravenous contrast. Multiplanar reconstructed images including MIPs were obtained and reviewed to evaluate the vascular anatomy.  Contrast: OMNIPAQUE IOHEXOL 350 MG/ML SOLN  Comparison: None  Findings: No intramural hematoma or periaortic hemorrhage on precontrast images. At least to right thyroid nodules largest 1.9 x 1.9 x 1.2 cm. Coronary arterial stent. Aorta normal caliber without aneurysm or dissection. Pulmonary arteries grossly patent without evidence of pulmonary embolism. No definite thoracic adenopathy. Inhomogeneous appearing nephrograms at the upper poles of both kidneys, suspect related to the beam hardening artifacts image 124.  Subsegmental atelectasis bilateral lower lobes. Generally low lung volumes. No definite infiltrate, pleural effusion or pneumothorax. No acute osseous findings.  IMPRESSION: No evidence of aortic aneurysm, aortic dissection or pulmonary embolism. Subsegmental atelectasis bilateral lower lobes. Likely artifacts at the kidneys. Right thyroid masses; recommend follow-up non emergent thyroid ultrasound.   Original Report Authenticated By: Ulyses Southward, M.D.    2D Echo:  09/06/12: Study Conclusions  - Left ventricle: The cavity size was normal. Wall thickness was increased. There was hypertrophy, with an appearance suggesting concentric remodeling (increased wall thickness with normal wall mass). Systolic function was vigorous. The estimated ejection fraction was in the range of 65% to 70%. Wall motion was normal; there were no regional wall motion abnormalities. Left ventricular diastolic function parameters were  normal for the patient's age. - Mitral valve: Mild thickening of the anterior leaflet, limited to the leaflet margin. - Left atrium: The atrium was moderately dilated. - Right atrium: The atrium was mildly dilated. - Pulmonary arteries: PA peak pressure: 34mm Hg (S). - Pericardium, extracardiac: A trivial pericardial effusion was identified posterior to the heart. Impressions:  - A sinus bradycardia rhythm was noted on this study, making this study technically difficult for the assessment of cardiac function.  Admission History of Present Illness:  Vickie Butler is a 52 yo F with h/o CVA with residual L sided weakness, CAD (s/p stent placement in 2008), GERD and asthma who presents with chest pain.  Pt presents with acute onset, sharp, constant chest pain that began one day prior to admission. Pain  radiates to the back and began while cleaning out the fridge/doing dishes. Prior to initiation of CP, she ate toast for breakfast. She tried tylenol with minimal relief. She does endorse chronic dyspnea on exertion, but no SOB currently. She has noticed increased swelling in her legs, arms and abdomen recently, and cannot sleep lying flat, sleeping on 2 pillows due to difficulty breathing. Of note, she has not taken any of her medications for the past 2 days, b/c she lives out of town and was visiting relatives.  She has had multiple ED visits for chest pain, most recently on 06/13/12, where her chest pain was determined to be from GERD. She did have a stress test 2 years prior, but cannot remember if was normal. Prior to her stent placement in 2008, she went to ED 4x with SOB and DOE. She feels like her current pain is different from previous pain she has experienced. Currently, she feels more fatigued. She was given ASA and Nitro in the ED which did not improve her pain. She was also given a GI cocktail in the ED and has been pain free since.  She states that she had a clot in her lungs last year and was  treated with Lovenox for 3weeks to 1 month by her PCP, Odette Horns in [facility]. Dr. Melvyn Neth was the one who stopped the Lovenox. No IVC filter was placed.  She also endorses cold intolerance and thinning hair, but has never had her thyroid function checked.  She has a h/o of asthma and uses her Albuterol 3x week, last used last week. She denies any smoking history.  PCP: Odette Horns, [facility], [place]  DeMarie (neuro) - [place], [place]  Cardiologist: Gearlean Alf - [place]   Review of Systems:  A 10 point ROS was performed; pertinent positives and negatives were noted in the HPI   Physical Exam:  Blood pressure 133/87, pulse 69, temperature 97.8 F (36.6 C), temperature source Oral, resp. rate 16, SpO2 96.00%.  General: Oriented, well-developed, and cooperative on examination.  Head: Normocephalic and atraumatic.  Eyes: Pupils equal, round, and reactive to light, no injection and anicteric.  Mouth: Pharynx pink and moist Neck: Supple, full ROM, no thyromegaly, no JVD, and no carotid bruits.  Chest: Pain reproducible on palpation, but per pt not as severe.  Lungs: CTAB, normal respiratory effort, no accessory muscle use, no crackles, and no wheezes. Heart: Regular rate, regular rhythm, no murmur, no gallop, and no rub.  Abdomen: Soft, non-tender, non-distended, normal bowel sounds, no guarding, no rebound tenderness, no organomegaly.  Msk: No joint swelling, warmth, or erythema.  Extremities: 2+ radial and DP pulses bilaterally. 1+ BLE edema.  Neurologic: Alert & oriented X3, cranial nerves II-XII intact, diminished strength in BU &LE, 3/5  Skin: Turgor normal and no rashes.  Psych: Poor medical historian, appears somewhat sedated.    Hospital Course by problem list: Vickie Butler is a 52 yo F with h/o CVA with residual L sided weakness, CAD (s/p stent placement in 2008), GERD and asthma who presents with chest pain.   Atypical chest pain:  H/o multiple ED visits 2/2 chest pain, most  recently 2/14 where she was treated for GERD. Her pain is somewhat reproducible on exam, so this could have a MSK component. With her pain radiating into her back, this raises concern for aortic dissection or possibly a PE. A CTA was ordered in the ED and was negative for aortic dissection and PE, so these can be  ruled out. She does have a h/o of CAD with stent placement in 2008, so given her histroy, this could be ACS. Her EKG is unchanged from previous and troponin negative x4. She does have 1+ pitting BLE edema, which she states is worsening, and she does have 2 pillow orthopnea and no h/o of and ECHO, but 2D ECHO in the in the hospital shows vigorous systolic fx with EF 65-70%, diastolic function parameters normal for the patient's age. TSH was normal. In the ED, her pain did resolve after a GI cocktail, and given her previous history of GERD related chest pain, this episode could also be GERD related. She does also have significant social stressors in her life involving her family, which could also be contributing to her symptoms. - Nitro PRN  - ASA 81mg  po daily  - Coreg 25mg  po BID, irbesartan 300mg  po daily, Chlorthalidone 25mg  po daily  - Holding Norvasc 10mg  po daily until she is seen by her PCP. - Continuing Plavix 75mg  po daily  - Nurse to give PM meds prior to discharge home, but she will hold her evening Coreg due to soft blood pressures  CAD:  H/o stent placed in 2008 at [facility]. She is on Plavix daily and is seen by Gearlean Alf, her Cardiologist, but does not seem to have seen him lately. She is on an ARB, CCB, BB, thyazide, Statin, and has Nitro PRN, and does endorse medication compliance. No meds in 2 days b/c ran out- pt does not live here in [place]. - Continue ARB, BB, thiazide, and statin  - Continue Plavix  HTN:  Stable. BP elevated on admission, but once she began taking her BP meds regularly, her pressures became softer. She does have a BB, CCB, diuretic, and an ARB that  she is supposed to be taking at home, but she told nursing that she does not always take her medications. On admission, she had been out of her medications for 1-2 days. Restarted home meds on admission, but with her BP 99/63 prior to d/c, holding evening Coreg and she is to stop her Amlodipine until she is seen by her PCP.  - Coreg 25mg  po BID, holding evening dose  - Holding Norvasc 10mg  po daily  - Irbesartan in place of her home azilsartan  - Chlorthalidone 25mg  po daily   Hypokalemia:  Potassium 4.3 in the ED, but on recheck, it was down to 3.2, possibly dilutional, on repeat it was 3.4. Replacing with po KCl.   DM II:  CBGs controlled. On Metformin at home, which was held on admission, will restart at discharge.   Asthma:  Stable. Per pt, h/o of asthma. She takes Symbicort at home and albuterol PRN. She usually uses her Albuterol 2x week.   GERD:  H/o GERD, on Nexium at home. Endorses some reflux-like symptoms yesterday. In the ED, pain relieved with GI cocktail. Started Protonix in place of Nexium and gave a GI cocktail BID PRN, which seemed to improve her symptoms.  - restarting Nexium at discharge.   Seizure disorder:  Stable. No recent seizures. Pt unsure what type of seizures she has, but she takes Depakote regularly. No meds in 2-3 days prior to admission, Depakote level subtherapeutic this admission.  - Depakote 500mg  po TID, receiving evening dose prior to d/c home this evening   IBS:  Per pt, h/o of irritable bowel syndrome, for which she takes Bentyl, which we will continued on admission and discharge.  Discharge Vitals:  BP 99/63  Pulse 61  Temp(Src) 97.8 F (36.6 C) (Oral)  Resp 16  Ht 5\' 5"  (1.651 m)  Wt 213 lb 8 oz (96.843 kg)  BMI 35.53 kg/m2  SpO2 100%  Discharge Labs:  Results for orders placed during the hospital encounter of 09/05/12 (from the past 24 hour(s))  GLUCOSE, CAPILLARY     Status: None   Collection Time    09/05/12  8:47 PM       Result Value Range   Glucose-Capillary 71  70 - 99 mg/dL  TROPONIN I     Status: None   Collection Time    09/05/12 10:19 PM      Result Value Range   Troponin I <0.30  <0.30 ng/mL  TSH     Status: None   Collection Time    09/05/12 10:19 PM      Result Value Range   TSH 1.629  0.350 - 4.500 uIU/mL  HEMOGLOBIN A1C     Status: Abnormal   Collection Time    09/05/12 10:19 PM      Result Value Range   Hemoglobin A1C 6.3 (*) <5.7 %   Mean Plasma Glucose 134 (*) <117 mg/dL  VALPROIC ACID LEVEL     Status: Abnormal   Collection Time    09/05/12 10:19 PM      Result Value Range   Valproic Acid Lvl <10.0 (*) 50.0 - 100.0 ug/mL  BASIC METABOLIC PANEL     Status: Abnormal   Collection Time    09/05/12 10:19 PM      Result Value Range   Sodium 136  135 - 145 mEq/L   Potassium 3.2 (*) 3.5 - 5.1 mEq/L   Chloride 98  96 - 112 mEq/L   CO2 26  19 - 32 mEq/L   Glucose, Bld 194 (*) 70 - 99 mg/dL   BUN 9  6 - 23 mg/dL   Creatinine, Ser 9.60  0.50 - 1.10 mg/dL   Calcium 8.9  8.4 - 45.4 mg/dL   GFR calc non Af Amer 83 (*) >90 mL/min   GFR calc Af Amer >90  >90 mL/min  TROPONIN I     Status: None   Collection Time    09/06/12  4:56 AM      Result Value Range   Troponin I <0.30  <0.30 ng/mL  LIPID PANEL     Status: None   Collection Time    09/06/12  4:56 AM      Result Value Range   Cholesterol 101  0 - 200 mg/dL   Triglycerides 69  <098 mg/dL   HDL 58  >11 mg/dL   Total CHOL/HDL Ratio 1.7     VLDL 14  0 - 40 mg/dL   LDL Cholesterol 29  0 - 99 mg/dL  GLUCOSE, CAPILLARY     Status: Abnormal   Collection Time    09/06/12  7:26 AM      Result Value Range   Glucose-Capillary 131 (*) 70 - 99 mg/dL   Comment 1 Notify RN    GLUCOSE, CAPILLARY     Status: Abnormal   Collection Time    09/06/12 11:41 AM      Result Value Range   Glucose-Capillary 113 (*) 70 - 99 mg/dL   Comment 1 Notify RN      Signed: Genelle Gather 09/06/2012, 4:03 PM   Time Spent on Discharge:  Services Ordered on Discharge: None Equipment Ordered  on Discharge: None

## 2012-09-06 NOTE — Progress Notes (Signed)
Echocardiogram 2D Echocardiogram has been performed.  Emanuelle Hammerstrom 09/06/2012, 1:38 PM

## 2012-09-06 NOTE — H&P (Signed)
Internal Medicine Teaching Service Attending Note Date: 09/06/2012  Patient name: Vickie Butler  Medical record number: [id number]  Date of birth: [birth year]   CC: Chest pain  I have seen and evaluated Vickie Butler and discussed their care with the Residency Team.    Vickie Butler is a 51yo woman with PMH of CAD (stent placed in '09), CVA with residual L sided weakness, GERD, asthma who presented with substernal chest pain.  She notes that the onset was sudden while she was up doing dishes at her daughter's house.  She describes the pain as sharp, constant and radiating to the back.  She had only eaten toast for breakfast prior to the onset of the pain.  She did not notice any change with movement.  She tried tylenol and sleeping, but did not have any relief.  She has chronic DOE, but was not SOB on presentation.  She has had increased swelling in her LE over the past month which waxes and wanes, she reports 2 pillow orthopnea.  She has not taken her medications in 1-2 days 2/2 staying with her daughter.  She has a history of multiple ED visits in [place] for chest pain, most recent on 06/13/12 and it was thought to be due to GERD.  She reports having a stress test 2 years ago, cannot remember the results.  She reports this current episode of pain is different from the pain which precipitated her stent placement.  She was given ASA, nitro by ED.  When pain did not resolve she underwent a CTA of the chest.  She also received a GI cocktail in the ED, and her pain improved.    Further history includes a possible PE, for which she was only treated with Lovenox for 3 weeks.    Further symptoms include cold intolerance, thinning hair.  She uses her albuterol about 3X per week.  She denies smoking.    For further medical history, meds, allergies and ROS, please see resident note.   Physical Exam: Blood pressure 123/63, pulse 70, temperature 98.1 F (36.7 C), temperature source Oral, resp. rate 16, height 5'  5" (1.651 m), weight 213 lb 8 oz (96.843 kg), SpO2 96.00%. General appearance: alert, cooperative, appears stated age, mild distress and reports having abdominal pain due to needing to have a BM Head: Normocephalic, without obvious abnormality, atraumatic Eyes: EOMI, anicteric sclerae Neck: thyroid not palpable Lungs: + crackles at bases, cleared on coughing.  No wheezing, otherwise clear Heart: RR, NR, no murmur Abdomen: soft, NT, +BS Extremities: + minimal to trace edema to bilateral ankles, skin changes denoting recent edema but improved Pulses: 2+ and symmetric Skin: no rash Neuro: Strength diminished throughout, 3/5  Lab results: Results for orders placed during the hospital encounter of 09/05/12 (from the past 24 hour(s))  POCT I-STAT TROPONIN I     Status: None   Collection Time    09/05/12 10:40 AM      Result Value Range   Troponin i, poc 0.00  0.00 - 0.08 ng/mL   Comment 3           POCT I-STAT, CHEM 8     Status: Abnormal   Collection Time    09/05/12 10:42 AM      Result Value Range   Sodium 140  135 - 145 mEq/L   Potassium 4.3  3.5 - 5.1 mEq/L   Chloride 101  96 - 112 mEq/L   BUN 15  6 - 23 mg/dL  Creatinine, Ser 0.80  0.50 - 1.10 mg/dL   Glucose, Bld 161 (*) 70 - 99 mg/dL   Calcium, Ion 0.96  0.45 - 1.23 mmol/L   TCO2 31  0 - 100 mmol/L   Hemoglobin 14.6  12.0 - 15.0 g/dL   HCT 40.9  81.1 - 91.4 %  LIPASE, BLOOD     Status: None   Collection Time    09/05/12 11:06 AM      Result Value Range   Lipase 45  11 - 59 U/L  URINALYSIS, ROUTINE W REFLEX MICROSCOPIC     Status: None   Collection Time    09/05/12 12:08 PM      Result Value Range   Color, Urine YELLOW  YELLOW   APPearance CLEAR  CLEAR   Specific Gravity, Urine 1.019  1.005 - 1.030   pH 6.0  5.0 - 8.0   Glucose, UA NEGATIVE  NEGATIVE mg/dL   Hgb urine dipstick NEGATIVE  NEGATIVE   Bilirubin Urine NEGATIVE  NEGATIVE   Ketones, ur NEGATIVE  NEGATIVE mg/dL   Protein, ur NEGATIVE  NEGATIVE mg/dL    Urobilinogen, UA 0.2  0.0 - 1.0 mg/dL   Nitrite NEGATIVE  NEGATIVE   Leukocytes, UA NEGATIVE  NEGATIVE  GLUCOSE, CAPILLARY     Status: None   Collection Time    09/05/12  1:40 PM      Result Value Range   Glucose-Capillary 89  70 - 99 mg/dL  TROPONIN I     Status: None   Collection Time    09/05/12  2:37 PM      Result Value Range   Troponin I <0.30  <0.30 ng/mL  GLUCOSE, CAPILLARY     Status: None   Collection Time    09/05/12  8:47 PM      Result Value Range   Glucose-Capillary 71  70 - 99 mg/dL  TROPONIN I     Status: None   Collection Time    09/05/12 10:19 PM      Result Value Range   Troponin I <0.30  <0.30 ng/mL  VALPROIC ACID LEVEL     Status: Abnormal   Collection Time    09/05/12 10:19 PM      Result Value Range   Valproic Acid Lvl <10.0 (*) 50.0 - 100.0 ug/mL  BASIC METABOLIC PANEL     Status: Abnormal   Collection Time    09/05/12 10:19 PM      Result Value Range   Sodium 136  135 - 145 mEq/L   Potassium 3.2 (*) 3.5 - 5.1 mEq/L   Chloride 98  96 - 112 mEq/L   CO2 26  19 - 32 mEq/L   Glucose, Bld 194 (*) 70 - 99 mg/dL   BUN 9  6 - 23 mg/dL   Creatinine, Ser 7.82  0.50 - 1.10 mg/dL   Calcium 8.9  8.4 - 95.6 mg/dL   GFR calc non Af Amer 83 (*) >90 mL/min   GFR calc Af Amer >90  >90 mL/min  TROPONIN I     Status: None   Collection Time    09/06/12  4:56 AM      Result Value Range   Troponin I <0.30  <0.30 ng/mL  LIPID PANEL     Status: None   Collection Time    09/06/12  4:56 AM      Result Value Range   Cholesterol 101  0 - 200 mg/dL   Triglycerides 69  <  150 mg/dL   HDL 58  >52 mg/dL   Total CHOL/HDL Ratio 1.7     VLDL 14  0 - 40 mg/dL   LDL Cholesterol 29  0 - 99 mg/dL  GLUCOSE, CAPILLARY     Status: Abnormal   Collection Time    09/06/12  7:26 AM      Result Value Range   Glucose-Capillary 131 (*) 70 - 99 mg/dL   Comment 1 Notify RN      Imaging results:  Dg Chest Port 1 View  09/05/2012   *RADIOLOGY REPORT*  Clinical Data: Chest pain   PORTABLE CHEST - 1 VIEW  Comparison: 06/13/2012  Findings: Cardiomegaly again noted.  No pulmonary edema.  There is streaky bilateral basilar atelectasis or early infiltrate.  IMPRESSION: No pulmonary edema.  Streaky bilateral basilar atelectasis or infiltrate.   Original Report Authenticated By: Natasha Mead, M.D.   Ct Angio Chest Aortic Dissect W &/or W/o  09/05/2012   *RADIOLOGY REPORT*  Clinical Data: Chest pain radiating to back, shortness of breath, history coronary artery disease post stenting, question aortic dissection  CT ANGIOGRAPHY CHEST  Technique:  Multidetector CT imaging of the chest using the standard protocol during bolus administration of intravenous contrast. Multiplanar reconstructed images including MIPs were obtained and reviewed to evaluate the vascular anatomy.  Contrast: OMNIPAQUE IOHEXOL 350 MG/ML SOLN  Comparison: None  Findings: No intramural hematoma or periaortic hemorrhage on precontrast images. At least to right thyroid nodules largest 1.9 x 1.9 x 1.2 cm. Coronary arterial stent. Aorta normal caliber without aneurysm or dissection. Pulmonary arteries grossly patent without evidence of pulmonary embolism. No definite thoracic adenopathy. Inhomogeneous appearing nephrograms at the upper poles of both kidneys, suspect related to the beam hardening artifacts image 124.  Subsegmental atelectasis bilateral lower lobes. Generally low lung volumes. No definite infiltrate, pleural effusion or pneumothorax. No acute osseous findings.  IMPRESSION: No evidence of aortic aneurysm, aortic dissection or pulmonary embolism. Subsegmental atelectasis bilateral lower lobes. Likely artifacts at the kidneys. Right thyroid masses; recommend follow-up non emergent thyroid ultrasound.   Original Report Authenticated By: Ulyses Southward, M.D.    Assessment and Plan: I agree with the formulated Assessment and Plan with the following changes:   1. Atypical chest pain - Typical features: started with  activity, location.  Atypical features: improved with GI cocktail, somewhat reproducible.  She does have a history of non obstructive coronary disease, so a workup for ACS is warranted; she is also a diabetic and female which may make ACS with atypical chest pain more likely.  Given her LE edema, would also consider TTE for further evaluation, particularly with her long history of DOE and orthopnea.  Interestingly, her CXR did not show any Pulmonary edema.  A CTA reported no PE or aortic aneurysm.  This may all be related to her GI disease, making esophageal spasm a likely possibility.  Will do the normal things like check a lipid panel, A1C, start an aspirin with pre-medication of zofran.  She is already on coreg, arb, plavix and statin.  Will continue these medications.    2. Thyroid nodule - Will check TSH, if elevated, could consider a radionucleotide scan to evaluate for a "hot" nodule.  - If low, would treat with synthroid - Further work up would likely occur outpatient.   Further issues per resident note.   Inez Catalina, MD 5/20/201410:20 AM

## 2012-09-06 NOTE — Progress Notes (Signed)
Medical Student Daily Progress Note  Subjective: NAE overnight. Reports chest pain when "washing up" this AM; deceased since yesterday.  No SOB.  Otherwise no complaints.     Objective: Vital signs in last 24 hours: Filed Vitals:   09/05/12 2031 09/05/12 2303 09/06/12 0516 09/06/12 0812  BP: 142/90  123/63   Pulse: 75 68 70   Temp: 97.8 F (36.6 C)  98.1 F (36.7 C)   TempSrc: Oral  Oral   Resp:  16    Height: 5\' 5"  (1.651 m)     Weight: 96.843 kg (213 lb 8 oz)     SpO2: 99% 97% 95% 96%   Weight change:  No intake or output data in the 24 hours ending 09/06/12 0925  Physical Exam: BP 123/63  Pulse 70  Temp(Src) 98.1 F (36.7 C) (Oral)  Resp 16  Ht 5\' 5"  (1.651 m)  Wt 96.843 kg (213 lb 8 oz)  BMI 35.53 kg/m2  SpO2 96% General appearance: alert, cooperative and no distress  Head: Normocephalic, without obvious abnormality, atraumatic  Eyes: EOMI. Sclera nonicteric.  Throat: lips, mucosa, and tongue normal; teeth and gums normal  Neck: no adenopathy, no carotid bruit, no JVD, supple, symmetrical, trachea midline, thyroid not enlarged, symmetric, no tenderness/mass/nodules and tender to palpation on R neck.  Lungs: clear to auscultation bilaterally  Heart: regular rate and rhythm, S1, S2 normal, no murmur, click, rub or gallop  Abdomen: soft, non-tender; bowel sounds normal; no masses, no organomegaly  Extremities: trace edema in BLE, improved from yesterday; no cyanosis or clubbing  Pulses: 2+ and symmetric  Skin: Skin color, texture, turgor normal. No rashes or lesions  Neurologic: grossly intact  Lab Results: Basic Metabolic Panel:  Recent Labs  16/10/96 1042 09/05/12 2219  NA 140 136  K 4.3 3.2*  CL 101 98  CO2  --  26  GLUCOSE 102* 194*  BUN 15 9  CREATININE 0.80 0.80  CALCIUM  --  8.9   Liver Function Tests: No results found for this basename: AST, ALT, ALKPHOS, BILITOT, PROT, ALBUMIN,  in the last 72 hours  Recent Labs  09/05/12 1106  LIPASE 45    No results found for this basename: AMMONIA,  in the last 72 hours CBC:  Recent Labs  09/05/12 0909 09/05/12 1042  WBC 7.0  --   NEUTROABS 3.6  --   HGB 12.6 14.6  HCT 37.7 43.0  MCV 66.3*  --   PLT 205  --    Cardiac Enzymes:  Recent Labs  09/05/12 1437 09/05/12 2219 09/06/12 0456  TROPONINI <0.30 <0.30 <0.30   BNP: No results found for this basename: PROBNP,  in the last 72 hours D-Dimer: No results found for this basename: DDIMER,  in the last 72 hours CBG:  Recent Labs  09/05/12 1340 09/05/12 2047 09/06/12 0726  GLUCAP 89 71 131*   Hemoglobin A1C: No results found for this basename: HGBA1C,  in the last 72 hours Fasting Lipid Panel:  Recent Labs  09/06/12 0456  CHOL 101  HDL 58  LDLCALC 29  TRIG 69  CHOLHDL 1.7   Thyroid Function Tests: No results found for this basename: TSH, T4TOTAL, FREET4, T3FREE, THYROIDAB,  in the last 72 hours Anemia Panel: No results found for this basename: VITAMINB12, FOLATE, FERRITIN, TIBC, IRON, RETICCTPCT,  in the last 72 hours Coagulation: No results found for this basename: LABPROT, INR,  in the last 72 hours Urine Drug Screen: Drugs of Abuse  No results  found for this basename: labopia, cocainscrnur, labbenz, amphetmu, thcu, labbarb    Alcohol Level: No results found for this basename: ETH,  in the last 72 hours Urinalysis:  Recent Labs  09/05/12 1208  COLORURINE YELLOW  LABSPEC 1.019  PHURINE 6.0  GLUCOSEU NEGATIVE  HGBUR NEGATIVE  BILIRUBINUR NEGATIVE  KETONESUR NEGATIVE  PROTEINUR NEGATIVE  UROBILINOGEN 0.2  NITRITE NEGATIVE  LEUKOCYTESUR NEGATIVE    Micro Results: No results found for this or any previous visit (from the past 240 hour(s)).  Studies/Results: Dg Chest Port 1 View  09/05/2012   *RADIOLOGY REPORT*  Clinical Data: Chest pain  PORTABLE CHEST - 1 VIEW  Comparison: 06/13/2012  Findings: Cardiomegaly again noted.  No pulmonary edema.  There is streaky bilateral basilar atelectasis  or early infiltrate.  IMPRESSION: No pulmonary edema.  Streaky bilateral basilar atelectasis or infiltrate.   Original Report Authenticated By: Natasha Mead, M.D.   Ct Angio Chest Aortic Dissect W &/or W/o  09/05/2012   *RADIOLOGY REPORT*  Clinical Data: Chest pain radiating to back, shortness of breath, history coronary artery disease post stenting, question aortic dissection  CT ANGIOGRAPHY CHEST  Technique:  Multidetector CT imaging of the chest using the standard protocol during bolus administration of intravenous contrast. Multiplanar reconstructed images including MIPs were obtained and reviewed to evaluate the vascular anatomy.  Contrast: OMNIPAQUE IOHEXOL 350 MG/ML SOLN  Comparison: None  Findings: No intramural hematoma or periaortic hemorrhage on precontrast images. At least to right thyroid nodules largest 1.9 x 1.9 x 1.2 cm. Coronary arterial stent. Aorta normal caliber without aneurysm or dissection. Pulmonary arteries grossly patent without evidence of pulmonary embolism. No definite thoracic adenopathy. Inhomogeneous appearing nephrograms at the upper poles of both kidneys, suspect related to the beam hardening artifacts image 124.  Subsegmental atelectasis bilateral lower lobes. Generally low lung volumes. No definite infiltrate, pleural effusion or pneumothorax. No acute osseous findings.  IMPRESSION: No evidence of aortic aneurysm, aortic dissection or pulmonary embolism. Subsegmental atelectasis bilateral lower lobes. Likely artifacts at the kidneys. Right thyroid masses; recommend follow-up non emergent thyroid ultrasound.   Original Report Authenticated By: Ulyses Southward, M.D.    Medications: I have reviewed the patient's current medications. Scheduled Meds: . amLODipine  10 mg Oral Daily  . aspirin EC  81 mg Oral Daily  . atorvastatin  20 mg Oral QHS  . budesonide-formoterol  2 puff Inhalation BID  . carvedilol  25 mg Oral BID WC  . chlorthalidone  25 mg Oral Daily  . clopidogrel   75 mg Oral Daily  . dicyclomine  10 mg Oral TID AC & HS  . divalproex  500 mg Oral TID  . enoxaparin (LOVENOX) injection  40 mg Subcutaneous Q24H  . irbesartan  300 mg Oral Daily  . pantoprazole  40 mg Oral Daily  . potassium chloride  40 mEq Oral Once  . sodium chloride  3 mL Intravenous Q12H   Continuous Infusions:  PRN Meds:.sodium chloride, acetaminophen, albuterol, gi cocktail, LORazepam, nitroGLYCERIN, ondansetron (ZOFRAN) IV, sodium chloride   Assessment/Plan: 52 yo F with a h/o CAD s/p stent in 2008, CVA x 2, HTN, DM, asthma, seizures, PE, and IBS, with substernal chest pain.   Principal Problem:   Atypical chest pain Active Problems:   CAD (coronary artery disease)   Diabetes mellitus   Hypertension   Seizures   Asthma   GERD (gastroesophageal reflux disease)   LOS: 1 day   Chest Pain  -Etiology most likely GERD though thyroid disease  and new onset heart failure have not yet been ruled out. ACS unlikely given troponins wnl x 3.  Anxiety may be a contributing factor as well, given high family stress.  -BLE edema may be related to medication noncompliance. -Echo with contrast this AM. Records from PCP indicate plan to obtain an Echo. -Lipid panel wnl.  -TSH pending. -Daily weights. Strict Is/Os.  -APAP 650 mg q4h prn pain.  -Cardiac monitoring.   CAD  -ASA 81 mg; zofran 4 mg IV for nausea associated with ASA administration.  -Continue home atorvastatin 20 mg daily, carvedilol 25 mg, and plavix 75 mg  -Irbesartan 300 mg daily.  -Nitrostat 0.4mg  q45m prn x3. -Heart healthy diet. Medlocked.   GERD  -Pain decreased with administration of GI cocktails, suggesting GERD may be origin of chest pain.  -Continue GI cocktail BID.  -Protonix 40 mg daily.   HTN  -HDS.  -Continue home amlodipine 10 mg daily, chlorthalidone 25 mg   DM  -Stable.  -POCT CBG monitoring.  -HgA1C pending.  -Holding home metformin.   Asthma  -Stable on RA.  -Continue home symbicort and  albuterol   Seizures  -Stable -Depakote level <10. -Depakote 500 mg TID.  IBS  -Stable.  -Continue home amitriptyline 25 mg daily, bentyl 10 mg TID.  -Holding home doxepin.   Chronic Back Pain  -Stable  -Continue home baclofen.   Anxiety  -Stable  -Continue home ativan 1 mg prn daily.   Access: L hand PIV and R AC PIV (NS 5/19)   Code: full   Prophylaxis: lovenox   Dispo: floor status  -Anticipated date of discharge: today   This is a Psychologist, occupational Note.  The care of the patient was discussed with Dr. Dorma Russell and the assessment and plan formulated with their assistance.  Please see their attached note for official documentation of the daily encounter.  Harland Dingwall [place]st[place]low 09/06/2012, 9:25 AM

## 2012-09-09 NOTE — Discharge Summary (Signed)
I saw Ms. Vickie Butler on the day of discharge and assisted the team in the formulation of the plan.

## 2012-09-09 NOTE — H&P (Signed)
Internal Medicine Teaching Service Resident Admission Note Date: 09/05/2012  Patient name: Vickie Butler Medical record number: [id number] Date of birth: 1960/12/10 Age: 52 y.o. Gender: female PCP: Pcp Not In System  Medical Service:  I have reviewed the note by Harland Dingwall, MS-3 and was present during the interview and physical exam.  Please see my separate note for findings, assessment, and plan.   Signed: Genelle Gather 09/09/2012, 6:36 PM

## 2012-09-09 NOTE — Progress Notes (Signed)
Resident Co-sign Daily Note: I have seen the patient and reviewed the daily progress note by [facility] MS-3 and discussed the care of the patient with her.  See my separate note for documentation of my findings, assessment, and plans.    LOS: 1 day   Genelle Gather 09/09/2012, 6:37 PM

## 2012-12-27 ENCOUNTER — Emergency Department (INDEPENDENT_AMBULATORY_CARE_PROVIDER_SITE_OTHER)
Admission: EM | Admit: 2012-12-27 | Discharge: 2012-12-27 | Disposition: A | Discharge status: Home / Self Care | Payer: Medicare Other | Source: Home / Self Care | Attending: Emergency Medicine | Admitting: Emergency Medicine

## 2012-12-27 ENCOUNTER — Encounter (HOSPITAL_COMMUNITY): Payer: Self-pay | Admitting: Emergency Medicine

## 2012-12-27 DIAGNOSIS — G43909 Migraine, unspecified, not intractable, without status migrainosus: Secondary | ICD-10-CM

## 2012-12-27 MED ORDER — OXYCODONE-ACETAMINOPHEN 5-325 MG PO TABS: ORAL_TABLET | ORAL | Status: DC

## 2012-12-27 MED ORDER — KETOROLAC TROMETHAMINE 60 MG/2ML IM SOLN
INTRAMUSCULAR | Status: AC
  Filled 2012-12-27: qty 2

## 2012-12-27 MED ORDER — DIPHENHYDRAMINE HCL 50 MG/ML IJ SOLN
INTRAMUSCULAR | Status: AC
  Filled 2012-12-27: qty 1

## 2012-12-27 MED ORDER — METOCLOPRAMIDE HCL 5 MG/ML IJ SOLN
10.0000 mg | Freq: Once | INTRAMUSCULAR | Status: AC
  Administered 2012-12-27: 10 mg via INTRAMUSCULAR

## 2012-12-27 MED ORDER — KETOROLAC TROMETHAMINE 60 MG/2ML IM SOLN
60.0000 mg | Freq: Once | INTRAMUSCULAR | Status: AC
  Administered 2012-12-27: 60 mg via INTRAMUSCULAR

## 2012-12-27 MED ORDER — DIPHENHYDRAMINE HCL 50 MG/ML IJ SOLN
25.0000 mg | Freq: Once | INTRAMUSCULAR | Status: AC
  Administered 2012-12-27: 25 mg via INTRAMUSCULAR

## 2012-12-27 MED ORDER — METOCLOPRAMIDE HCL 5 MG/ML IJ SOLN
INTRAMUSCULAR | Status: AC
  Filled 2012-12-27: qty 2

## 2012-12-27 NOTE — ED Notes (Signed)
Pt c/o HA onset yest around 1730 Sxs include: chills, dry cough, LLQ abd pain, diarrhea Denies: fevers Alert w/no signs of acute distress.

## 2012-12-27 NOTE — ED Provider Notes (Signed)
Chief Complaint:   Chief Complaint  Patient presents with  . Headache    History of Present Illness:   Vickie Butler is a 52 year old female who has had a severe headache since last night. This is bitemporal and pounding, worse on the left than the right. Last night it was a 10 over 10 and now is down to 8/10. It's been associated with nausea, vomiting, diarrhea, photophobia, phonophobia, and dizziness. Her vision has been blurry and she felt chilled but not had a fever. She denies any stiff neck. She has had no localizing neurological symptoms such as paresthesias, numbness, tingling, or localized muscle weakness, difficulty with speech, swallowing, ambulation, coordination, or balance. She has had a history of similar headaches in the past and thinks she has been told that she has migraine headaches. She was seen in the emergency room about a week ago with chest pain and numbness in her left arm. This was in the emergency room in [place], [place]. Her cardiac workup was negative. She has a history of coronary artery disease and a stent. She also has diabetes and a history of CVAs.  Review of Systems:  Other than noted above, the patient denies any of the following symptoms: Systemic:  No fever, chills, fatigue, photophobia, stiff neck. Eye:  No redness, eye pain, discharge, blurred vision, or diplopia. ENT:  No nasal congestion, rhinorrhea, sinus pressure or pain, sneezing, earache, or sore throat.  No jaw claudication. Neuro:  No paresthesias, loss of consciousness, seizure activity, muscle weakness, trouble with coordination or gait, trouble speaking or swallowing. Psych:  No depression, anxiety or trouble sleeping.  PMFSH:  Past medical history, family history, social history, meds, and allergies were reviewed.  Current meds include Proventil, Elavil, Lipitor, baclofen, Symbicort, Coreg, chlorthalidone, Plavix, until, Depakote, doxepin, Nexium, Ativan, metformin, nitroglycerin, and  Zantac. She has allergies to Dilaudid, Ultram, Valtrex, and penicillins. She has a history of diabetes, hypertension, seizures, asthma, gastroesophageal reflux, CVA, and thyroid nodules.  Physical Exam:   Vital signs:  BP 154/95  Pulse 80  Temp(Src) 98.2 F (36.8 C) (Oral)  Resp 18  SpO2 100% General:  Alert and oriented.  In no distress. Eye:  Lids and conjunctivas normal.  PERRL,  Full EOMs.  Fundi benign with normal discs and vessels. ENT:  No cranial or facial tenderness to palpation.  TMs and canals clear.  Nasal mucosa was normal and uncongested without any drainage. No intra oral lesions, pharynx clear, mucous membranes moist, dentition normal. Neck:  Supple, full ROM, no tenderness to palpation.  No adenopathy or mass. Neuro:  Alert and orented times 3.  Speech was clear, fluent, and appropriate.  Cranial nerves intact. No pronator drift, muscle strength normal. Finger to nose normal.  DTRs were 2+ and symmetrical.Station and gait were normal.  Romberg's sign was normal.  Able to perform tandem gait well. Psych:  Normal affect.  Course in Urgent Care Center:   Given Toradol 60 mg IM, Reglan 10 mg IM, and Benadryl 25 mg IM.  Assessment:  The encounter diagnosis was Migraine headache.  With cardiac history reluctant to use Imitrex and with diabetes I'm reluctant to use corticosteroids.  Plan:   1.  Meds:  The following meds were prescribed:   Discharge Medication List as of 12/27/2012  5:49 PM    START taking these medications   Details  oxyCODONE-acetaminophen (PERCOCET) 5-325 MG per tablet 1 to 2 tablets every 6 hours as needed for pain., Print  2.  Patient Education/Counseling:  The patient was given appropriate handouts, self care instructions, and instructed in symptomatic relief.    3.  Follow up:  The patient was told to follow up if no better in 3 to 4 days, if becoming worse in any way, and given some red flag symptoms such as fever or new neurological symptoms  which would prompt immediate return.  Follow up here or with her primary care physician at home as needed.     Reuben Likes, MD 12/27/12 [phone]

## 2012-12-27 NOTE — ED Notes (Signed)
Pt states daughter will be driving her home.

## 2013-04-25 ENCOUNTER — Emergency Department (INDEPENDENT_AMBULATORY_CARE_PROVIDER_SITE_OTHER)
Admission: EM | Admit: 2013-04-25 | Discharge: 2013-04-25 | Disposition: A | Discharge status: Home / Self Care | Payer: Medicare Other | Source: Home / Self Care | Attending: Family Medicine | Admitting: Family Medicine

## 2013-04-25 ENCOUNTER — Encounter (HOSPITAL_COMMUNITY): Payer: Self-pay | Admitting: Emergency Medicine

## 2013-04-25 ENCOUNTER — Other Ambulatory Visit (HOSPITAL_COMMUNITY)
Admission: RE | Admit: 2013-04-25 | Discharge: 2013-04-25 | Disposition: A | Discharge status: Home / Self Care | Payer: Medicare Other | Source: Ambulatory Visit | Attending: Family Medicine | Admitting: Family Medicine

## 2013-04-25 DIAGNOSIS — N760 Acute vaginitis: Secondary | ICD-10-CM | POA: Insufficient documentation

## 2013-04-25 DIAGNOSIS — H9209 Otalgia, unspecified ear: Secondary | ICD-10-CM

## 2013-04-25 DIAGNOSIS — Z113 Encounter for screening for infections with a predominantly sexual mode of transmission: Secondary | ICD-10-CM | POA: Insufficient documentation

## 2013-04-25 DIAGNOSIS — H9202 Otalgia, left ear: Secondary | ICD-10-CM

## 2013-04-25 DIAGNOSIS — G43909 Migraine, unspecified, not intractable, without status migrainosus: Secondary | ICD-10-CM

## 2013-04-25 MED ORDER — DEXAMETHASONE SODIUM PHOSPHATE 10 MG/ML IJ SOLN
INTRAMUSCULAR | Status: AC
  Filled 2013-04-25: qty 1

## 2013-04-25 MED ORDER — ANTIPYRINE-BENZOCAINE 5.4-1.4 % OT SOLN: 3.0000 [drp] | OTIC | Status: AC | PRN

## 2013-04-25 MED ORDER — IPRATROPIUM BROMIDE 0.06 % NA SOLN: 2.0000 | Freq: Four times a day (QID) | NASAL | Status: AC

## 2013-04-25 MED ORDER — METRONIDAZOLE 500 MG PO TABS: 500.0000 mg | ORAL_TABLET | Freq: Two times a day (BID) | ORAL | Status: AC

## 2013-04-25 MED ORDER — KETOROLAC TROMETHAMINE 60 MG/2ML IM SOLN
30.0000 mg | Freq: Once | INTRAMUSCULAR | Status: AC
  Administered 2013-04-25: 30 mg via INTRAMUSCULAR

## 2013-04-25 MED ORDER — DEXAMETHASONE SODIUM PHOSPHATE 10 MG/ML IJ SOLN
10.0000 mg | Freq: Once | INTRAMUSCULAR | Status: AC
  Administered 2013-04-25: 10 mg via INTRAMUSCULAR

## 2013-04-25 MED ORDER — METOCLOPRAMIDE HCL 5 MG/ML IJ SOLN
5.0000 mg | Freq: Once | INTRAMUSCULAR | Status: AC
  Administered 2013-04-25: 5 mg via INTRAMUSCULAR

## 2013-04-25 MED ORDER — KETOROLAC TROMETHAMINE 30 MG/ML IJ SOLN
INTRAMUSCULAR | Status: AC
  Filled 2013-04-25: qty 1

## 2013-04-25 MED ORDER — METOCLOPRAMIDE HCL 5 MG/ML IJ SOLN
INTRAMUSCULAR | Status: AC
  Filled 2013-04-25: qty 2

## 2013-04-25 NOTE — Discharge Instructions (Signed)
Thank you for coming in today. Followup with your Dr. about your migraines I think you have a cold causing ear congestion and nasal congestion. Use eardrops as needed for ear pain. Use Atrovent nasal spray as needed for runny nose and congestion. Take metronidazole twice daily for one week for vaginal discharge. Followup with your Dr. Nyra CapesGo to the emergency room if your headache becomes excruciating or you have weakness or numbness or uncontrolled vomiting.  If your belly pain worsens, or you have high fever, bad vomiting, blood in your stool or black tarry stool go to the Emergency Room.  Call or go to the emergency room if you get worse, have trouble breathing, have chest pains, or palpitations.

## 2013-04-25 NOTE — ED Notes (Signed)
C/o left earache with headache that has been going on for two days. Pain is 7/10. Stated she has taken Tylenol with no relief. Also c/o vaginal discharge that is brown in color and smells really bad. Written by: Marga MelnickQuaNeisha Jones, SMA

## 2013-04-25 NOTE — ED Provider Notes (Signed)
Vickie Butler is a 53 y.o. female who presents to Urgent Care today for  1) migraine headache starting Sunday. Patient notes significant bandlike pounding headache. This is consistent with prior episodes migraines. Patient denies any nausea vomiting or diarrhea. She does note some mild photophobia. She denies any difficulty with activity or motion. 2) left ear pain starting Saturday associated with nasal congestion and cough. No change in hearing. Patient feels well otherwise no fevers or chills. 3) vaginal discharge present for the last for 5 days. She denies any significant pelvic pain. She notes it is foul-smelling. This is consistent with prior episodes of BV.    Past Medical History  Diagnosis Date  . Diabetes mellitus   . Hypertension   . Seizures   . Asthma   . GERD (gastroesophageal reflux disease)   . CVA (cerebral infarction)     x2, affected speech the first time, left side strength the 2nd time  . Thyroid nodule 09/05/12    incidental on Chest CT, right sided; 1.9 x 1.9 x 1.2 cm   History  Substance Use Topics  . Smoking status: Never Smoker   . Smokeless tobacco: Not on file  . Alcohol Use: No   ROS as above Medications reviewed. No current facility-administered medications for this encounter.   Current Outpatient Prescriptions  Medication Sig Dispense Refill  . acetaminophen (TYLENOL) 500 MG tablet Take 500 mg by mouth every 6 (six) hours as needed. For pain      . albuterol (PROVENTIL) (5 MG/ML) 0.5% nebulizer solution Take 2.5 mg by nebulization every 6 (six) hours as needed for wheezing.      Marland Kitchen amitriptyline (ELAVIL) 25 MG tablet Take 25 mg by mouth at bedtime.      Marland Kitchen antipyrine-benzocaine (AURALGAN) otic solution Place 3-4 drops into the left ear every 2 (two) hours as needed for ear pain.  10 mL  0  . atorvastatin (LIPITOR) 20 MG tablet Take 20 mg by mouth at bedtime.       . Azilsartan Medoxomil 80 MG TABS Take 80 mg by mouth daily. Edarbi      . baclofen  (LIORESAL) 20 MG tablet Take 20 mg by mouth 3 (three) times daily.      . budesonide-formoterol (SYMBICORT) 80-4.5 MCG/ACT inhaler Inhale 2 puffs into the lungs 2 (two) times daily.      . carvedilol (COREG) 25 MG tablet Take 25 mg by mouth 2 (two) times daily with a meal.      . chlorthalidone (HYGROTON) 25 MG tablet Take 25 mg by mouth daily.      . clopidogrel (PLAVIX) 75 MG tablet Take 75 mg by mouth daily.      Marland Kitchen dicyclomine (BENTYL) 10 MG capsule Take 10 mg by mouth 4 (four) times daily -  before meals and at bedtime.      . divalproex (DEPAKOTE) 500 MG DR tablet Take 500 mg by mouth 3 (three) times daily.      Marland Kitchen doxepin (SINEQUAN) 25 MG capsule Take 25 mg by mouth every other day.      . esomeprazole (NEXIUM) 40 MG capsule Take 40 mg by mouth daily before breakfast.      . ipratropium (ATROVENT) 0.06 % nasal spray Place 2 sprays into both nostrils 4 (four) times daily.  15 mL  1  . LORazepam (ATIVAN) 1 MG tablet Take 1 mg by mouth daily as needed for anxiety.      . metFORMIN (GLUCOPHAGE) 500 MG tablet Take  500 mg by mouth 2 (two) times daily with a meal. According to cbg, for sugar      . metroNIDAZOLE (FLAGYL) 500 MG tablet Take 1 tablet (500 mg total) by mouth 2 (two) times daily.  14 tablet  0  . nitroGLYCERIN (NITROSTAT) 0.4 MG SL tablet Place 0.4 mg under the tongue every 5 (five) minutes as needed. For chest pain      . ranitidine (ZANTAC) 150 MG tablet Take 150 mg by mouth daily as needed. For indigestion        Exam:  BP 128/74  Pulse 73  Temp(Src) 97.9 F (36.6 C) (Oral)  Resp 18 Gen: Well NAD HEENT: EOMI,  MMM left TM is retracted. Right is normal. Post pharynx is normal appearing.  Lungs: Normal work of breathing. CTABL Heart: RRR no MRG Abd: NABS, Soft. NT, ND Exts: Non edematous BL  LE, warm and well perfused.  GYN: Normal external genitalia. Vaginal canal with thin white discharge. Normal appearing cervix. No cervical motion tenderness. Neuro: Alert and oriented  PERRLA cranial nerves II through XII are intact normal coordination reflexes sensation gait and balance   Assessment and Plan: 53 y.o. female with  1) migraine: Headache is typical migraine. Plan to treat with IM Toradol, dexamethasone, Reglan. Followup with primary care provider or neurologist. Patient may benefit from migraine prophylaxis. 2) ear pain due to retracted tympanic membrane. This is likely secondary to eustachian tube dysfunction due to URI. Plan to treat with Auralgan 3) vaginal discharge. Cytology pending. Plan to treat with metronidazole.  Followup with primary care provider.  Discussed warning signs or symptoms. Please see discharge instructions. Patient expresses understanding.      Rodolph BongEvan S Laronda Lisby, MD 04/25/13 1247

## 2013-04-28 ENCOUNTER — Telehealth (HOSPITAL_COMMUNITY): Payer: Self-pay | Admitting: *Deleted

## 2013-04-28 NOTE — ED Notes (Addendum)
1/7  GC/Chlamydia neg., Affirm: Candida and Gardnerella neg., Trich pos. Pt. adequately treated with Flagyl. 1/9   I called pt. and left a message to call. Call 1. Vassie MoselleYork, Lauralynn Loeb M 04/28/2013 I called pt. Pt. verified x 2 and given results.  Pt. told she was adequately treated and to finish all of her medication. Pt. instructed to notify her partner to be treated with Flagyl, no sex until she had finished her medication and her partner has been treated and to practice safe sex. Pt. voiced understanding. Vassie MoselleYork, Vin Yonke M 05/02/2013

## 2013-06-01 IMAGING — CR DG CHEST 2V
2 series · 2 of 2 positions shown · non-contrast
Comparison: None.

CLINICAL DATA: Worsening chest pain.

CHEST - 2 VIEW

[w chest pa]
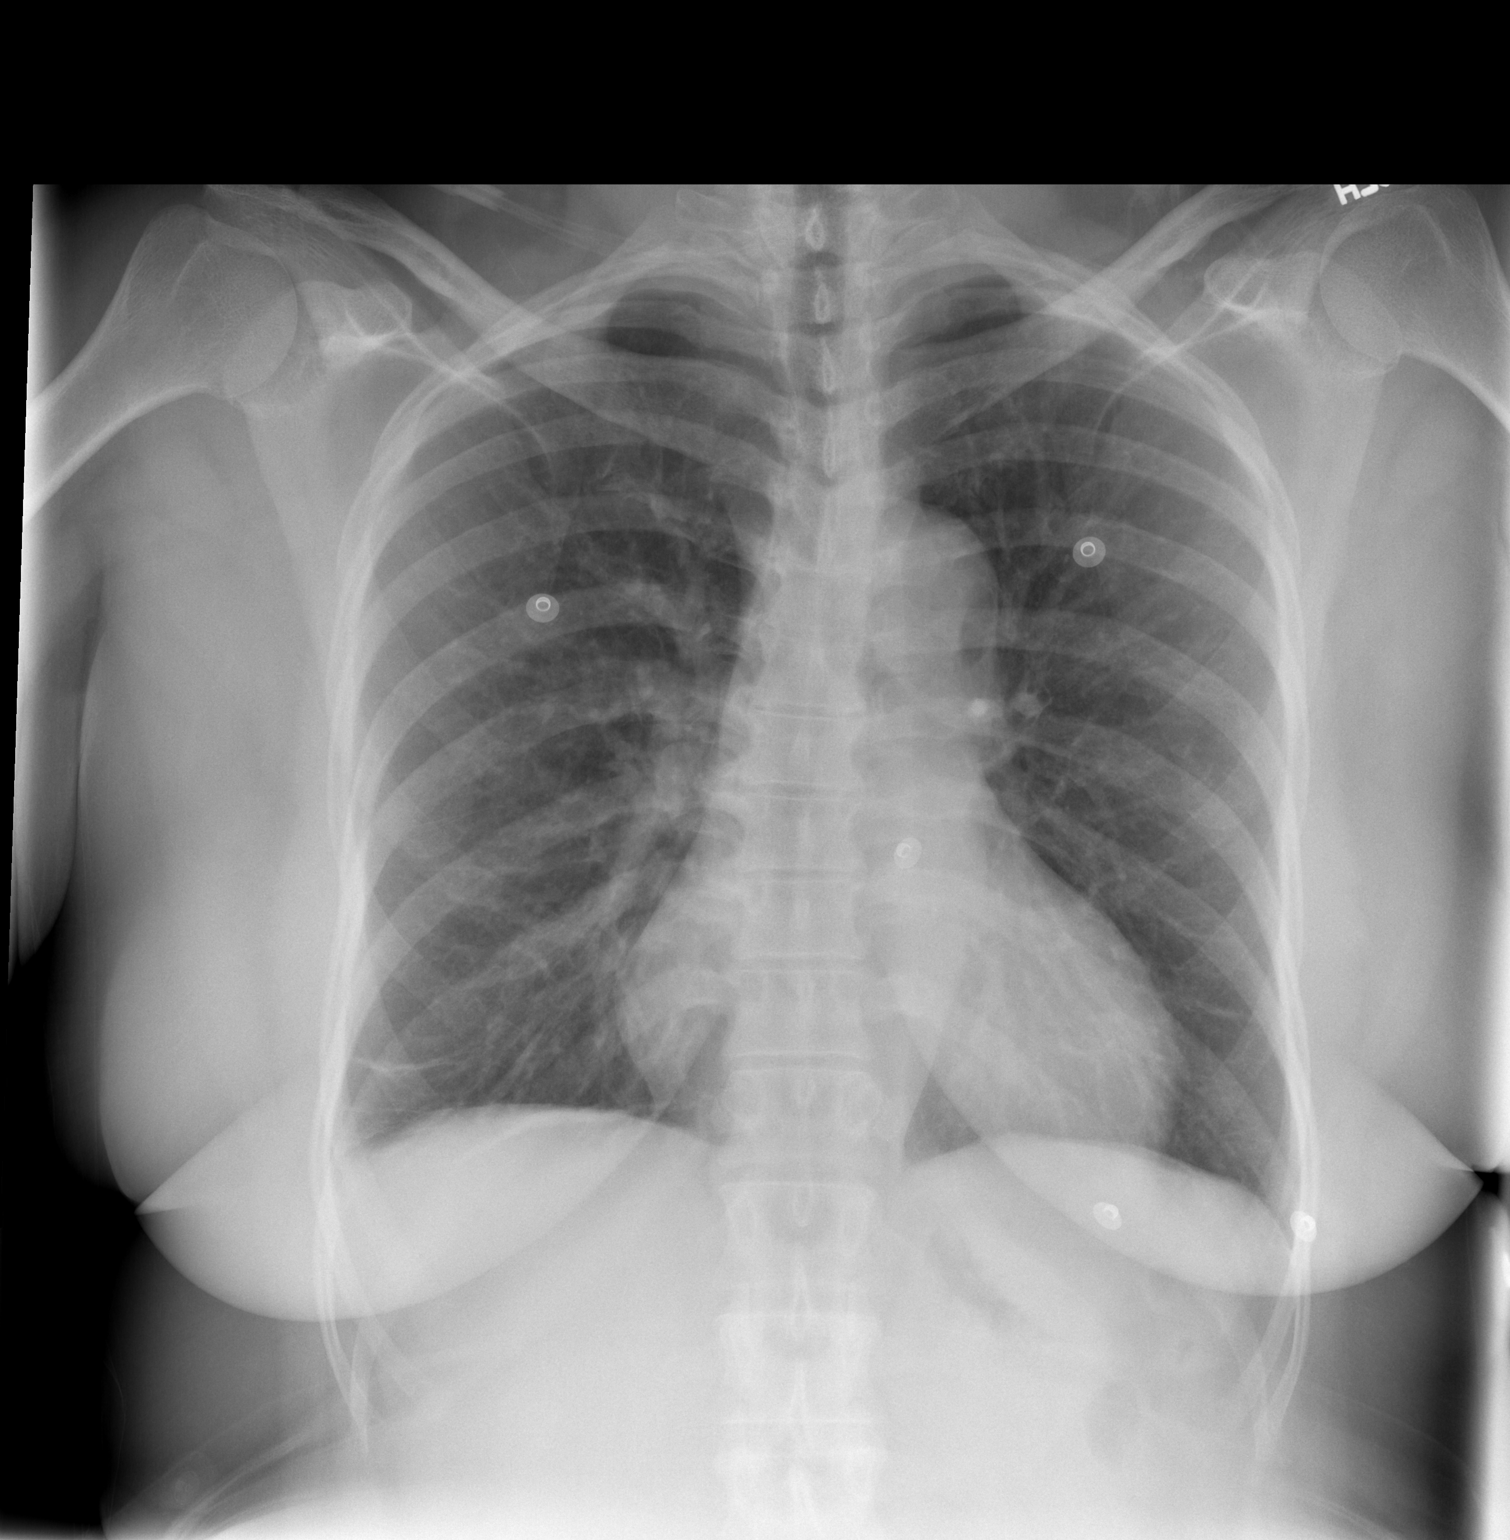

[w chest lat]
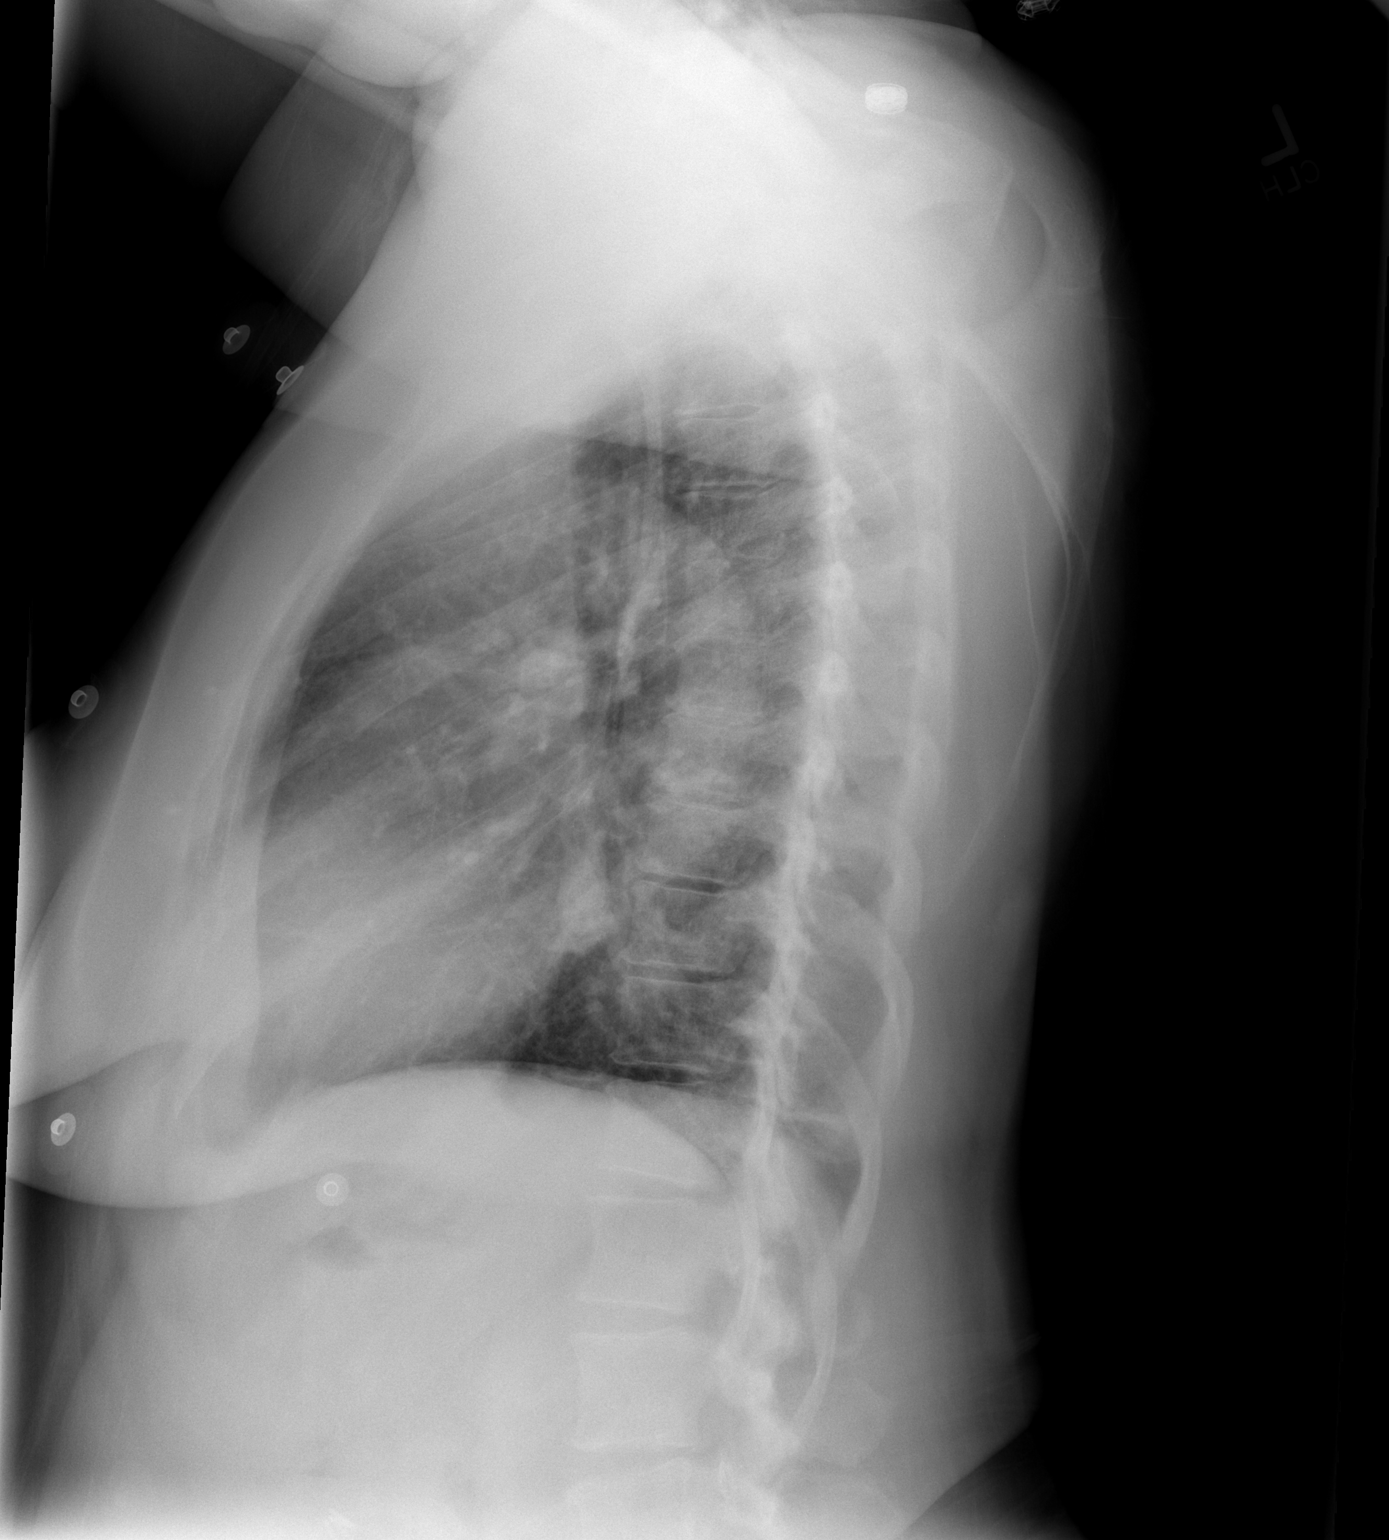

[2 of 2 positions shown; findings below may reference images not displayed]

FINDINGS: The lungs are well-aerated.  Minimal bibasilar opacities
may reflect atelectasis or scarring.  There is no evidence of
pleural effusion or pneumothorax.

The heart is borderline normal in size; the mediastinal contour is
within normal limits.  No acute osseous abnormalities are seen.
IMPRESSION: Minimal bibasilar airspace opacities may reflect atelectasis or
scarring.  No acute cardiopulmonary process seen.

## 2014-02-10 IMAGING — CR DG CHEST 2V
2 series · 2 of 2 positions shown · non-contrast
Comparison: 10/03/2011 chest radiograph

CLINICAL DATA: Shortness of breath.

CHEST - 2 VIEW

[x chest ap]
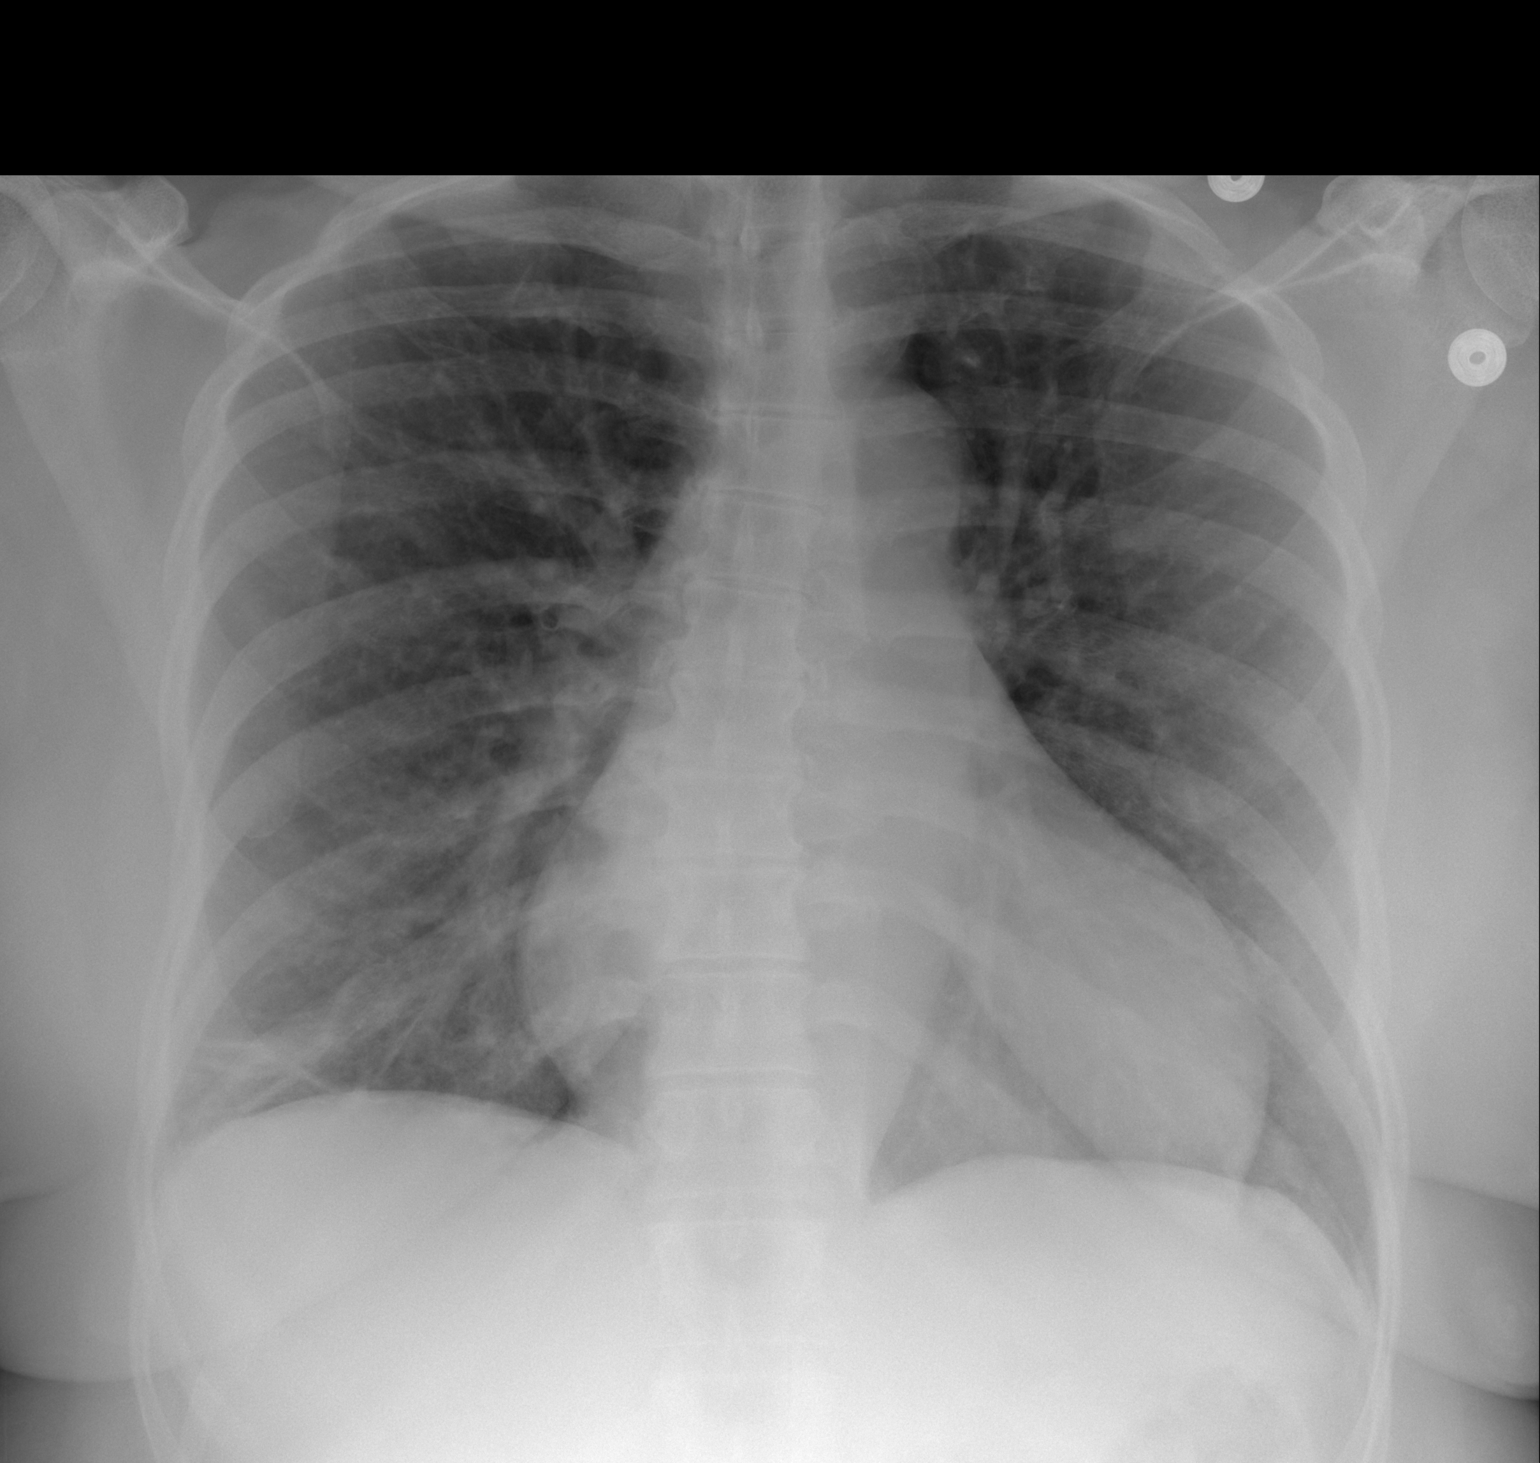

[w chest lat]
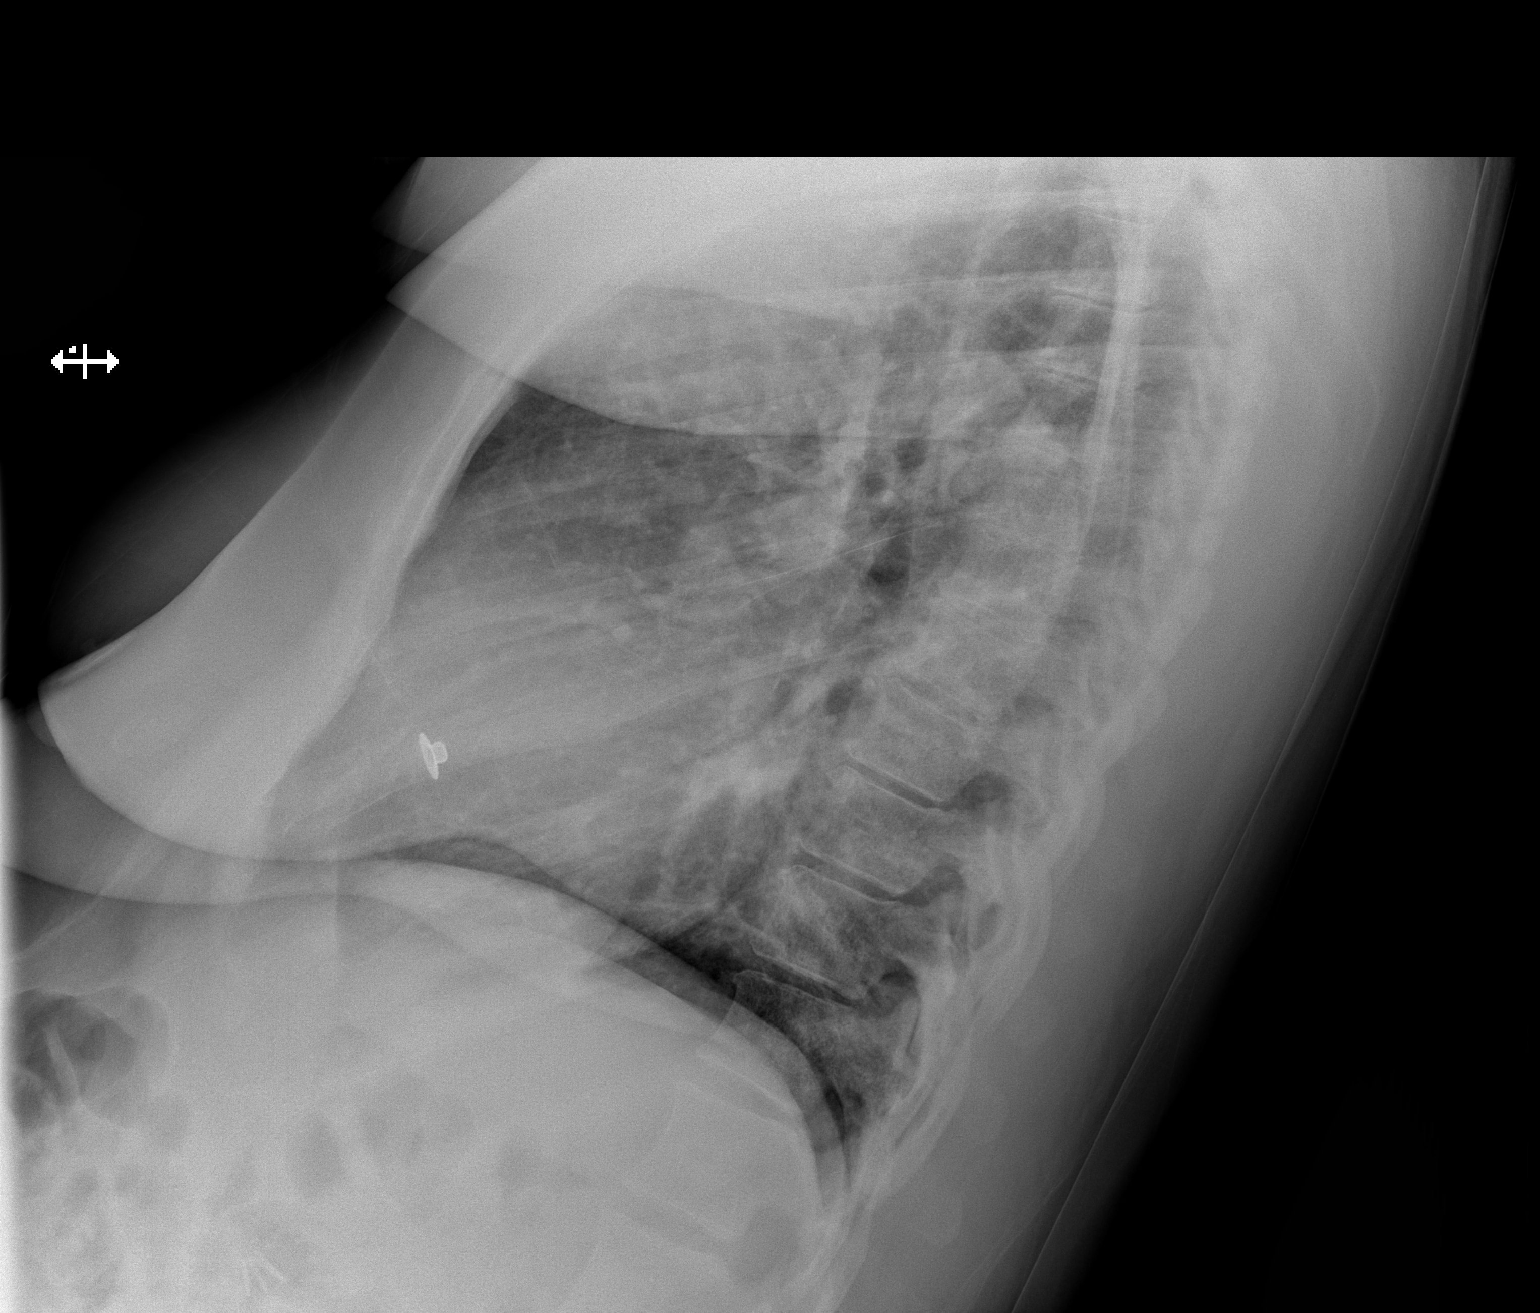

[2 of 2 positions shown; findings below may reference images not displayed]

FINDINGS: Cardiomegaly and pulmonary vascular congestion noted.
Right basilar scarring is present.
There is no evidence of focal airspace disease, pulmonary edema,
suspicious pulmonary nodule/mass, pleural effusion, or
pneumothorax.
No acute bony abnormalities are identified.
IMPRESSION: Cardiomegaly with pulmonary vascular congestion.

## 2014-05-05 IMAGING — CR DG CHEST 1V PORT
1 series · 1 of 1 positions shown · non-contrast
Comparison: 06/13/2012

CLINICAL DATA: Chest pain

PORTABLE CHEST - 1 VIEW

[AP]
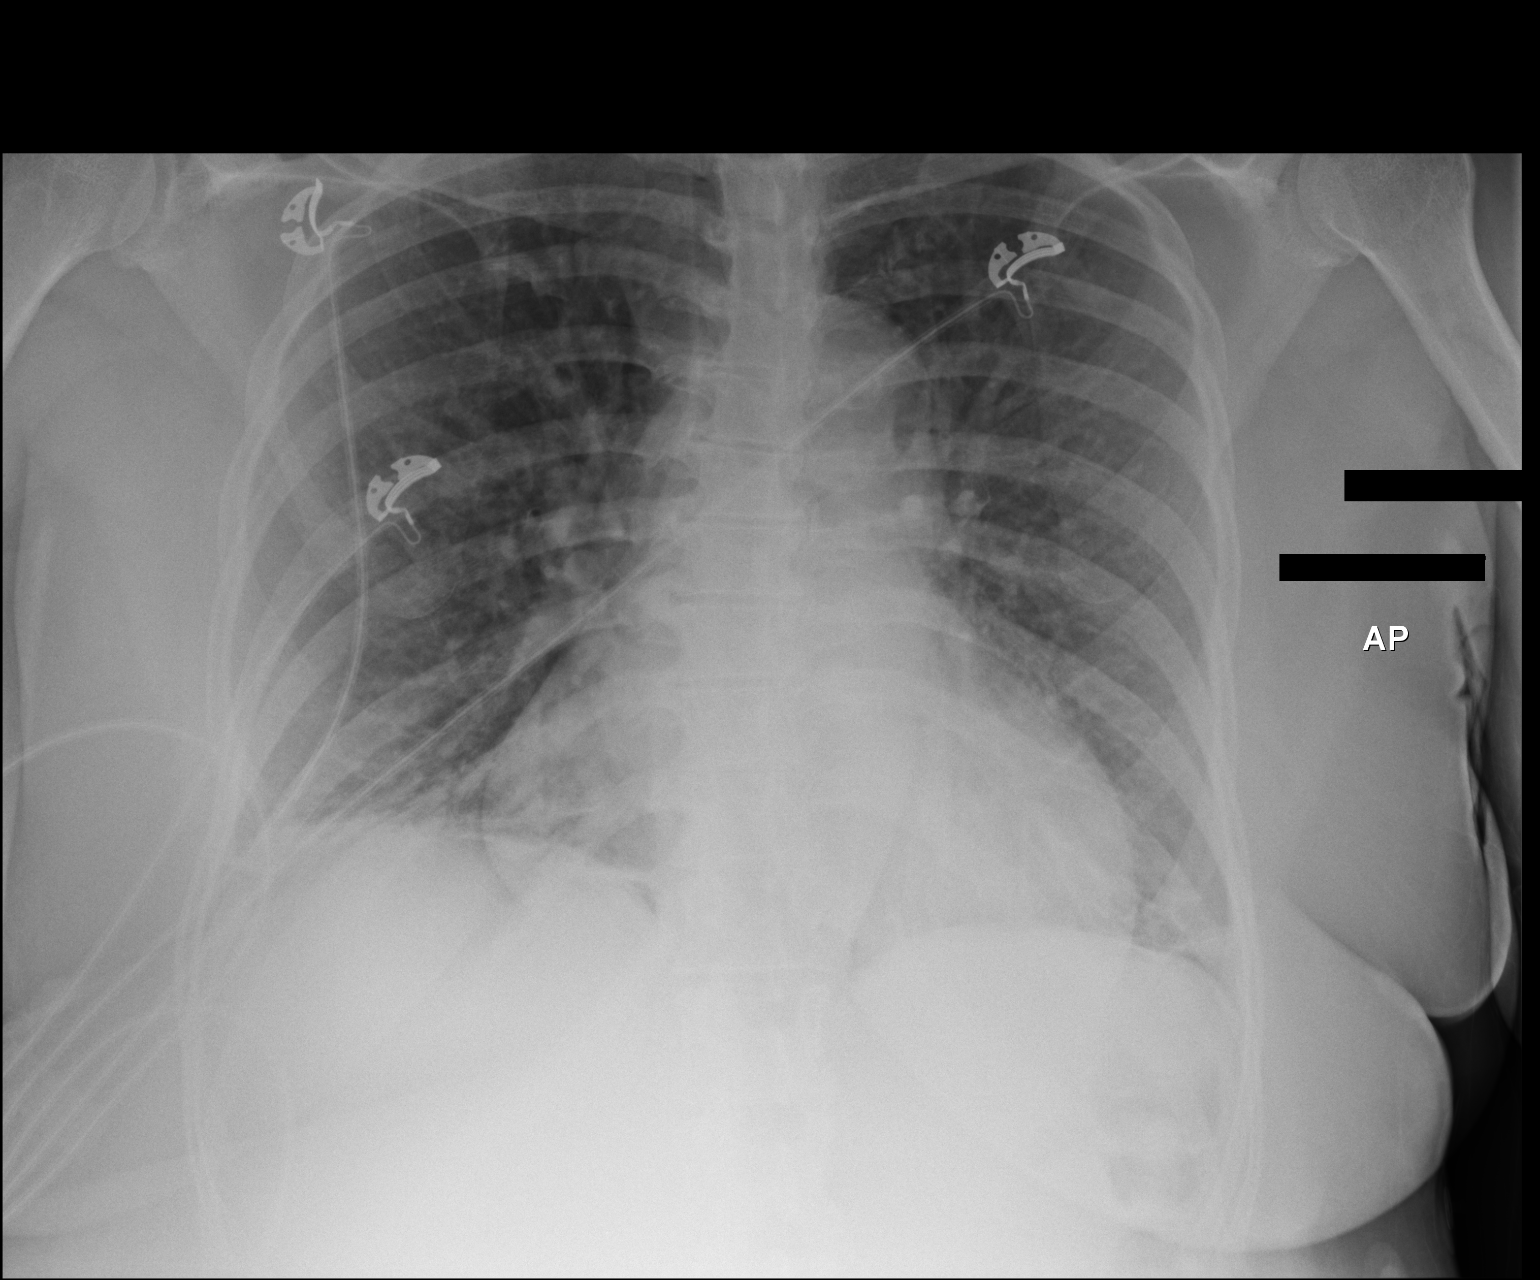

[1 of 1 positions shown; findings below may reference images not displayed]

FINDINGS: Cardiomegaly again noted.  No pulmonary edema.  There is
streaky bilateral basilar atelectasis or early infiltrate.
IMPRESSION: No pulmonary edema.  Streaky bilateral basilar atelectasis or
infiltrate.

## 2021-06-08 ENCOUNTER — Encounter (HOSPITAL_BASED_OUTPATIENT_CLINIC_OR_DEPARTMENT_OTHER): Payer: Self-pay | Admitting: Emergency Medicine

## 2021-06-08 ENCOUNTER — Other Ambulatory Visit: Payer: Self-pay

## 2021-06-08 ENCOUNTER — Emergency Department (HOSPITAL_BASED_OUTPATIENT_CLINIC_OR_DEPARTMENT_OTHER): Payer: Medicare PPO | Admitting: Radiology

## 2021-06-08 ENCOUNTER — Emergency Department (HOSPITAL_BASED_OUTPATIENT_CLINIC_OR_DEPARTMENT_OTHER)
Admission: EM | Admit: 2021-06-08 | Discharge: 2021-06-09 | Disposition: A | Discharge status: Home / Self Care | Payer: Medicare PPO | Attending: Emergency Medicine | Admitting: Emergency Medicine

## 2021-06-08 DIAGNOSIS — E119 Type 2 diabetes mellitus without complications: Secondary | ICD-10-CM | POA: Insufficient documentation

## 2021-06-08 DIAGNOSIS — Z7901 Long term (current) use of anticoagulants: Secondary | ICD-10-CM | POA: Insufficient documentation

## 2021-06-08 DIAGNOSIS — R202 Paresthesia of skin: Secondary | ICD-10-CM | POA: Insufficient documentation

## 2021-06-08 DIAGNOSIS — Z7984 Long term (current) use of oral hypoglycemic drugs: Secondary | ICD-10-CM | POA: Diagnosis not present

## 2021-06-08 DIAGNOSIS — Z79899 Other long term (current) drug therapy: Secondary | ICD-10-CM | POA: Insufficient documentation

## 2021-06-08 DIAGNOSIS — M5441 Lumbago with sciatica, right side: Secondary | ICD-10-CM | POA: Insufficient documentation

## 2021-06-08 DIAGNOSIS — I10 Essential (primary) hypertension: Secondary | ICD-10-CM | POA: Insufficient documentation

## 2021-06-08 DIAGNOSIS — M25551 Pain in right hip: Secondary | ICD-10-CM | POA: Diagnosis not present

## 2021-06-08 DIAGNOSIS — M545 Low back pain, unspecified: Secondary | ICD-10-CM | POA: Diagnosis present

## 2021-06-08 MED ORDER — HYDROCODONE-ACETAMINOPHEN 5-325 MG PO TABS: 1.0000 | ORAL_TABLET | Freq: Four times a day (QID) | ORAL | 0 refills | Status: AC | PRN

## 2021-06-08 MED ORDER — PREDNISONE 10 MG PO TABS: 40.0000 mg | ORAL_TABLET | Freq: Every day | ORAL | 0 refills | Status: AC

## 2021-06-08 MED ORDER — MORPHINE SULFATE (PF) 2 MG/ML IV SOLN
2.0000 mg | Freq: Once | INTRAVENOUS | Status: AC
  Administered 2021-06-08: 2 mg via INTRAMUSCULAR
  Filled 2021-06-08: qty 1

## 2021-06-08 NOTE — ED Notes (Signed)
Warm pack provided to pt and place on lower back for comfort.

## 2021-06-08 NOTE — ED Provider Notes (Addendum)
[facility] EMERGENC[facility] DEPT Provider Note   CSN: [id number] Arrival date & time: 06/08/21  1817     History  Chief Complaint  Patient presents with   Back Pain    Vickie Butler is a 61 y.o. female.  Patient has been undergoing physical therapy and outpatient therapy following an incident after cardiac cath where she lost some use of her leg function.  And has been working to get it back.  She is here with her daughter now in this area going through the rehab.  Patient after them exercising her on Friday started to develop right lower back pain that is radiating down the side and back of the leg.  Has numbness on the bottom of the foot.  Patient is never had anything like this before.  Patient put a lidocaine patch on the back.  No [place] help.  Patient denies any left leg symptoms.  Denies any incontinence.  Patient has a lot of pain with movement in the right lower back and right hip area.  No fall or injury.  Patient has a history significant for diabetes hypertension reflux disease history of CVA x2 infected speech the first time left-sided strength the second time.  Patient's had a partial hysterectomy.      Home Medications Prior to Admission medications   Medication Sig Start Date End Date Taking? Authorizing Provider  acetaminophen (TYLENOL) 500 MG tablet Take 500 mg by mouth every 6 (six) hours as needed. For pain    [provider]  albuterol (PROVENTIL) (5 MG/ML) 0.5% nebulizer solution Take 2.5 mg by nebulization every 6 (six) hours as needed for wheezing.    [provider]  amitriptyline (ELAVIL) 25 MG tablet Take 25 mg by mouth at bedtime.    [provider]  antipyrine-benzocaine Toniann Fail) otic solution Place 3-4 drops into the left ear every 2 (two) hours as needed for ear pain. 04/25/13   Gregor Hams, MD  atorvastatin (LIPITOR) 20 MG tablet Take 20 mg by mouth at bedtime.     [provider]  Azilsartan Medoxomil 80 MG  TABS Take 80 mg by mouth daily. [place]    [provider]  baclofen (LIORESAL) 20 MG tablet Take 20 mg by mouth 3 (three) times daily.    [provider]  budesonide-formoterol (SYMBICORT) 80-4.5 MCG/ACT inhaler Inhale 2 puffs into the lungs 2 (two) times daily.    [provider]  carvedilol (COREG) 25 MG tablet Take 25 mg by mouth 2 (two) times daily with a meal.    [provider]  chlorthalidone (HYGROTON) 25 MG tablet Take 25 mg by mouth daily.    [provider]  clopidogrel (PLAVIX) 75 MG tablet Take 75 mg by mouth daily.    [provider]  dicyclomine (BENTYL) 10 MG capsule Take 10 mg by mouth 4 (four) times daily -  before meals and at bedtime.    [provider]  divalproex (DEPAKOTE) 500 MG DR tablet Take 500 mg by mouth 3 (three) times daily.    [provider]  doxepin (SINEQUAN) 25 MG capsule Take 25 mg by mouth every other day.    [provider]  esomeprazole (NEXIUM) 40 MG capsule Take 40 mg by mouth daily before breakfast.    [provider]  ipratropium (ATROVENT) 0.06 % nasal spray Place 2 sprays into both nostrils 4 (four) times daily. 04/25/13   Gregor Hams, MD  LORazepam (ATIVAN) 1 MG tablet Take 1  mg by mouth daily as needed for anxiety.    [provider]  metFORMIN (GLUCOPHAGE) 500 MG tablet Take 500 mg by mouth 2 (two) times daily with a meal. According to cbg, for sugar    [provider]  metroNIDAZOLE (FLAGYL) 500 MG tablet Take 1 tablet (500 mg total) by mouth 2 (two) times daily. 04/25/13   Gregor Hams, MD  nitroGLYCERIN (NITROSTAT) 0.4 MG SL tablet Place 0.4 mg under the tongue every 5 (five) minutes as needed. For chest pain    [provider]  ranitidine (ZANTAC) 150 MG tablet Take 150 mg by mouth daily as needed. For indigestion    [provider]      Allergies    Dilaudid [hydromorphone hcl], Ultram [tramadol], Valtrex [valacyclovir  hcl], and Penicillins    Review of Systems   Review of Systems  Constitutional:  Negative for chills and fever.  HENT:  Negative for ear pain and sore throat.   Eyes:  Negative for pain and visual disturbance.  Respiratory:  Negative for cough and shortness of breath.   Cardiovascular:  Negative for chest pain and palpitations.  Gastrointestinal:  Negative for abdominal pain and vomiting.  Genitourinary:  Negative for dysuria and hematuria.  Musculoskeletal:  Positive for back pain. Negative for arthralgias.  Skin:  Negative for color change and rash.  Neurological:  Positive for numbness. Negative for seizures, syncope and weakness.  All other systems reviewed and are negative.  Physical Exam Updated Vital Signs BP 133/68    Pulse 82    Temp 98.7 F (37.1 C) (Oral)    Resp 20    SpO2 100%  Physical Exam Vitals and nursing note reviewed.  Constitutional:      General: She is not in acute distress.    Appearance: Normal appearance. She is well-developed.  HENT:     Head: Normocephalic and atraumatic.  Eyes:     Extraocular Movements: Extraocular movements intact.     Conjunctiva/sclera: Conjunctivae normal.     Pupils: Pupils are equal, round, and reactive to light.  Cardiovascular:     Rate and Rhythm: Normal rate and regular rhythm.     Heart sounds: No murmur heard. Pulmonary:     Effort: Pulmonary effort is normal. No respiratory distress.     Breath sounds: Normal breath sounds.  Abdominal:     Palpations: Abdomen is soft.     Tenderness: There is no abdominal tenderness.  Musculoskeletal:        General: No swelling.     Cervical back: Normal range of motion and neck supple.     Comments: Right foot dorsalis pedis pulses 1+.  Patient states decree sensation to the bottom of the foot.  Normal top of the foot.  Toe strength is normal  Skin:    General: Skin is warm and dry.     Capillary Refill: Capillary refill takes less than 2 seconds.  Neurological:     Mental  Status: She is alert and oriented to person, place, and time.     Sensory: Sensory deficit present.  Psychiatric:        Mood and Affect: Mood normal.    ED Results / Procedures / Treatments   Labs (all labs ordered are listed, but only abnormal results are displayed) Labs Reviewed - No data to display  EKG None  Radiology No results found.  Procedures Procedures    Medications Ordered in ED Medications  morphine (PF) 2 MG/ML injection  2 mg (2 mg Intramuscular Given 06/08/21 2142)    ED Course/ Medical Decision Making/ A&P                           Medical Decision Making Amount and/or Complexity of Data Reviewed Radiology: ordered.  Risk Prescription drug management.   Patient's symptoms consistent with right-sided sciatica.  Since she was getting PT and had not been on her feet much since early January.  I will get x-rays of the lumbar spine and the right hip area just to make sure no bony abnormality.  Will treat with IM morphine here because she can have IM Dilaudid.  And will give a dose of prednisone here.  X-ray of the lumbar spine without any acute abnormalities.  X-ray of the right hip and pelvis without any acute abnormalities.  Feel that patient's symptoms are consistent with a right-sided sciatica.  We will continue prednisone at home.  And will give a prescription for pain medication.     Final Clinical Impression(s) / ED Diagnoses Final diagnoses:  Acute right-sided low back pain with right-sided sciatica    Rx / DC Orders ED Discharge Orders     None         Fredia Sorrow, MD 06/08/21 2144    Fredia Sorrow, MD 06/08/21 2312

## 2021-06-08 NOTE — ED Notes (Signed)
EDP at bedside to assess pt at this time.

## 2021-06-08 NOTE — ED Triage Notes (Signed)
Pt endorses right lower back pain since Friday night. Pt has lidocaine patch on back. Pt stays in a wheelchair throughout the day, seeing PT a few days a week. Pt had procedure in early Jan and had seizure during it, not been able to walk since.

## 2021-06-08 NOTE — Discharge Instructions (Addendum)
Symptoms consistent with a pinched nerve in the back.  X-rays without any significant findings.  Take the prednisone as directed.  This can potentially be healing.  Take the hydrocodone as needed for pain relief.  Make an appointment to follow-up with sports medicine.

## 2021-06-09 NOTE — ED Notes (Signed)
Assisted pt to car. States she has been wheelchair bound over the past year. Tolerated well.

## 2023-10-15 ENCOUNTER — Emergency Department (HOSPITAL_BASED_OUTPATIENT_CLINIC_OR_DEPARTMENT_OTHER)
Admission: EM | Admit: 2023-10-15 | Discharge: 2023-10-15 | Disposition: A | Discharge status: Home / Self Care | Attending: Emergency Medicine | Admitting: Emergency Medicine

## 2023-10-15 ENCOUNTER — Emergency Department (HOSPITAL_BASED_OUTPATIENT_CLINIC_OR_DEPARTMENT_OTHER)

## 2023-10-15 ENCOUNTER — Encounter (HOSPITAL_BASED_OUTPATIENT_CLINIC_OR_DEPARTMENT_OTHER): Payer: Self-pay | Admitting: Emergency Medicine

## 2023-10-15 ENCOUNTER — Other Ambulatory Visit: Payer: Self-pay

## 2023-10-15 ENCOUNTER — Emergency Department (HOSPITAL_BASED_OUTPATIENT_CLINIC_OR_DEPARTMENT_OTHER): Admitting: Radiology

## 2023-10-15 DIAGNOSIS — W108XXA Fall (on) (from) other stairs and steps, initial encounter: Secondary | ICD-10-CM | POA: Diagnosis not present

## 2023-10-15 DIAGNOSIS — Z79899 Other long term (current) drug therapy: Secondary | ICD-10-CM | POA: Diagnosis not present

## 2023-10-15 DIAGNOSIS — E119 Type 2 diabetes mellitus without complications: Secondary | ICD-10-CM | POA: Insufficient documentation

## 2023-10-15 DIAGNOSIS — R519 Headache, unspecified: Secondary | ICD-10-CM | POA: Diagnosis present

## 2023-10-15 DIAGNOSIS — S060X0A Concussion without loss of consciousness, initial encounter: Secondary | ICD-10-CM | POA: Diagnosis not present

## 2023-10-15 DIAGNOSIS — M25561 Pain in right knee: Secondary | ICD-10-CM | POA: Diagnosis not present

## 2023-10-15 DIAGNOSIS — J45909 Unspecified asthma, uncomplicated: Secondary | ICD-10-CM | POA: Diagnosis not present

## 2023-10-15 DIAGNOSIS — I10 Essential (primary) hypertension: Secondary | ICD-10-CM | POA: Diagnosis not present

## 2023-10-15 DIAGNOSIS — Z7984 Long term (current) use of oral hypoglycemic drugs: Secondary | ICD-10-CM | POA: Diagnosis not present

## 2023-10-15 MED ORDER — ACETAMINOPHEN 325 MG PO TABS
650.0000 mg | ORAL_TABLET | Freq: Once | ORAL | Status: AC
  Administered 2023-10-15: 650 mg via ORAL
  Filled 2023-10-15: qty 2

## 2023-10-15 NOTE — Discharge Instructions (Addendum)
 CT head reading:  3 cm right thyroid nodule. Recommend nonemergent thyroid Ultrasound  Please follow up with your primary care provider for this incidental finding.  Seek emergency care if experiencing any new or worsening symptoms.

## 2023-10-15 NOTE — ED Triage Notes (Signed)
 Pt caox4 c/o R knee pain since falling 2 days ago. Pt denies any other pain or injury but states she did hit her head and is on Plavix .

## 2023-10-15 NOTE — ED Provider Notes (Signed)
 [place] EMERGENCY DEPARTMENT AT [facility] Provider Note   CSN: [id number] Arrival date & time: 10/15/23  9068     Patient presents with: Knee Injury   Vickie Butler is a 63 y.o. female with PMHx asthma, DM, CVA, GERD, HTN, seizures who presents to ED concerned for right knee pain after a mechanical fall 2 days ago. Patient fell down ~3 steps. Patient is ambulatory but concerned about the pain in the right knee when she walks. Patient also endorses hitting her head during the fall and is on Plavix . She denies LOC or seizures since fall. Currently denies changes in vision but does endorse a mild headache. Patient denies any other acute symptoms today.  Last dose of Tylenol  was around 4AM.   HPI     Prior to Admission medications   Medication Sig Start Date End Date Taking? Authorizing Provider  acetaminophen  (TYLENOL ) 500 MG tablet Take 500 mg by mouth every 6 (six) hours as needed. For pain    [provider]  albuterol  (PROVENTIL ) (5 MG/ML) 0.5% nebulizer solution Take 2.5 mg by nebulization every 6 (six) hours as needed for wheezing.    [provider]  amitriptyline (ELAVIL) 25 MG tablet Take 25 mg by mouth at bedtime.    [provider]  antipyrine-benzocaine  JANECE) otic solution Place 3-4 drops into the left ear every 2 (two) hours as needed for ear pain. 04/25/13   Joane Artist RAMAN, MD  atorvastatin  (LIPITOR) 20 MG tablet Take 20 mg by mouth at bedtime.     [provider]  Azilsartan Medoxomil 80 MG TABS Take 80 mg by mouth daily. [place]    [provider]  baclofen (LIORESAL) 20 MG tablet Take 20 mg by mouth 3 (three) times daily.    [provider]  budesonide -formoterol  (SYMBICORT ) 80-4.5 MCG/ACT inhaler Inhale 2 puffs into the lungs 2 (two) times daily.    [provider]  carvedilol  (COREG ) 25 MG tablet Take 25 mg by mouth 2 (two) times daily with a meal.    [provider]  chlorthalidone   (HYGROTON ) 25 MG tablet Take 25 mg by mouth daily.    [provider]  clopidogrel  (PLAVIX ) 75 MG tablet Take 75 mg by mouth daily.    [provider]  dicyclomine  (BENTYL ) 10 MG capsule Take 10 mg by mouth 4 (four) times daily -  before meals and at bedtime.    [provider]  divalproex  (DEPAKOTE ) 500 MG DR tablet Take 500 mg by mouth 3 (three) times daily.    [provider]  doxepin (SINEQUAN) 25 MG capsule Take 25 mg by mouth every other day.    [provider]  esomeprazole  (NEXIUM ) 40 MG capsule Take 40 mg by mouth daily before breakfast.    [provider]  HYDROcodone -acetaminophen  (NORCO/VICODIN) 5-325 MG tablet Take 1 tablet by mouth every 6 (six) hours as needed for moderate pain. 06/08/21   Zackowski, Scott, MD  ipratropium (ATROVENT ) 0.06 % nasal spray Place 2 sprays into both nostrils 4 (four) times daily. 04/25/13   Corey, Evan S, MD  LORazepam  (ATIVAN ) 1 MG tablet Take 1 mg by mouth daily as needed for anxiety.    [provider]  metFORMIN (GLUCOPHAGE) 500 MG tablet Take 500 mg by mouth 2 (two) times daily with a meal. According to cbg, for sugar    [provider]  metroNIDAZOLE  (FLAGYL ) 500 MG tablet Take 1 tablet (500 mg total) by mouth 2 (two) times  daily. 04/25/13   Joane Artist RAMAN, MD  nitroGLYCERIN  (NITROSTAT ) 0.4 MG SL tablet Place 0.4 mg under the tongue every 5 (five) minutes as needed. For chest pain    [provider]  predniSONE  (DELTASONE ) 10 MG tablet Take 4 tablets (40 mg total) by mouth daily. 06/08/21   Zackowski, Scott, MD  ranitidine (ZANTAC) 150 MG tablet Take 150 mg by mouth daily as needed. For indigestion    [provider]    Allergies: Dilaudid [hydromorphone hcl], Ultram [tramadol], Valtrex [valacyclovir hcl], and Penicillins    Review of Systems  Musculoskeletal:        Knee pain    Updated Vital Signs BP (!) 173/106 (BP Location: Right Arm)   Pulse 72   Temp 98  F (36.7 C)   Resp 18   Ht 5' 5 (1.651 m)   Wt 96.2 kg   SpO2 100%   BMI 35.28 kg/m   Physical Exam Vitals and nursing note reviewed.  Constitutional:      General: She is not in acute distress.    Appearance: She is not ill-appearing or toxic-appearing.  HENT:     Head: Normocephalic and atraumatic.     Mouth/Throat:     Mouth: Mucous membranes are moist.   Eyes:     General: No scleral icterus.       Right eye: No discharge.        Left eye: No discharge.     Conjunctiva/sclera: Conjunctivae normal.    Cardiovascular:     Rate and Rhythm: Normal rate and regular rhythm.     Pulses: Normal pulses.     Heart sounds: Normal heart sounds. No murmur heard. Pulmonary:     Effort: Pulmonary effort is normal. No respiratory distress.     Breath sounds: Normal breath sounds. No wheezing, rhonchi or rales.  Abdominal:     General: Abdomen is flat.   Musculoskeletal:     Right lower leg: No edema.     Left lower leg: No edema.     Comments: Right knee ROM intact and without swelling, erythema, or increased warmth. +2 pedal pulse.    Skin:    General: Skin is warm and dry.     Findings: No rash.   Neurological:     General: No focal deficit present.     Mental Status: She is alert and oriented to person, place, and time. Mental status is at baseline.     Comments: GCS 15. Speech is goal oriented. No deficits appreciated to CN III-XII. Patient moves extremities without ataxia.  Psychiatric:        Mood and Affect: Mood normal.        Behavior: Behavior normal.     (all labs ordered are listed, but only abnormal results are displayed) Labs Reviewed - No data to display  EKG: None  Radiology: CT Head Wo Contrast Result Date: 10/15/2023 CLINICAL DATA:  Head trauma, moderate-severe; Polytrauma, blunt. EXAM: CT HEAD WITHOUT CONTRAST CT CERVICAL SPINE WITHOUT CONTRAST TECHNIQUE: Multidetector CT imaging of the head and cervical spine was performed following the standard  protocol without intravenous contrast. Multiplanar CT image reconstructions of the cervical spine were also generated. RADIATION DOSE REDUCTION: This exam was performed according to the departmental dose-optimization program which includes automated exposure control, adjustment of the mA and/or kV according to patient size and/or use of iterative reconstruction technique. COMPARISON:  None Available. FINDINGS: CT HEAD FINDINGS Brain: There is no evidence of an acute  infarct, intracranial hemorrhage, mass, midline shift, or extra-axial fluid collection. Cerebral volume is normal. The ventricles are normal in size. Vascular: No hyperdense vessel. Skull: No fracture or suspicious lesion. Sinuses/Orbits: Visualized paranasal sinuses and mastoid air cells are clear. Unremarkable orbits. Other: None. CT CERVICAL SPINE FINDINGS Alignment: Reversal of the normal cervical lordosis. No significant listhesis. Skull base and vertebrae: No acute fracture or suspicious lesion. Soft tissues and spinal canal: No prevertebral fluid or swelling. No visible canal hematoma. Disc levels: Moderate cervical spondylosis. Asymmetrically advanced left facet arthrosis at C2-3. No evidence of high-grade spinal canal stenosis. Upper chest: Clear lung apices. Other: Bilateral thyroid nodules, including an approximately 3 cm nodule on the right. IMPRESSION: 1. No evidence of acute intracranial or cervical spine injury. 2. 3 cm right thyroid nodule. Recommend nonemergent thyroid ultrasound (ref: J Am Coll Radiol. 2015 Feb;12(2): 143-50). Electronically Signed   By: Dasie Hamburg M.D.   On: 10/15/2023 10:41   CT Cervical Spine Wo Contrast Result Date: 10/15/2023 CLINICAL DATA:  Head trauma, moderate-severe; Polytrauma, blunt. EXAM: CT HEAD WITHOUT CONTRAST CT CERVICAL SPINE WITHOUT CONTRAST TECHNIQUE: Multidetector CT imaging of the head and cervical spine was performed following the standard protocol without intravenous contrast. Multiplanar CT  image reconstructions of the cervical spine were also generated. RADIATION DOSE REDUCTION: This exam was performed according to the departmental dose-optimization program which includes automated exposure control, adjustment of the mA and/or kV according to patient size and/or use of iterative reconstruction technique. COMPARISON:  None Available. FINDINGS: CT HEAD FINDINGS Brain: There is no evidence of an acute infarct, intracranial hemorrhage, mass, midline shift, or extra-axial fluid collection. Cerebral volume is normal. The ventricles are normal in size. Vascular: No hyperdense vessel. Skull: No fracture or suspicious lesion. Sinuses/Orbits: Visualized paranasal sinuses and mastoid air cells are clear. Unremarkable orbits. Other: None. CT CERVICAL SPINE FINDINGS Alignment: Reversal of the normal cervical lordosis. No significant listhesis. Skull base and vertebrae: No acute fracture or suspicious lesion. Soft tissues and spinal canal: No prevertebral fluid or swelling. No visible canal hematoma. Disc levels: Moderate cervical spondylosis. Asymmetrically advanced left facet arthrosis at C2-3. No evidence of high-grade spinal canal stenosis. Upper chest: Clear lung apices. Other: Bilateral thyroid nodules, including an approximately 3 cm nodule on the right. IMPRESSION: 1. No evidence of acute intracranial or cervical spine injury. 2. 3 cm right thyroid nodule. Recommend nonemergent thyroid ultrasound (ref: J Am Coll Radiol. 2015 Feb;12(2): 143-50). Electronically Signed   By: Dasie Hamburg M.D.   On: 10/15/2023 10:41   DG Knee 2 Views Right Result Date: 10/15/2023 CLINICAL DATA:  Pain after fall EXAM: RIGHT KNEE - 2 VIEW COMPARISON:  None Available. FINDINGS: No fracture or dislocation. Preserved joint spaces and bone mineralization. Minimal osteophytes along the medial compartment. No joint effusion on lateral view. IMPRESSION: Minimal degenerative changes of the medial compartment. Electronically Signed    By: Ranell Bring M.D.   On: 10/15/2023 10:21     Procedures   Medications Ordered in the ED  acetaminophen  (TYLENOL ) tablet 650 mg (650 mg Oral Given 10/15/23 1034)                                    Medical Decision Making Amount and/or Complexity of Data Reviewed Radiology: ordered.  Risk OTC drugs.   This patient presents to the ED after a fall, this involves an extensive number of treatment options, and is  a complaint that carries with it a high risk of complications and morbidity.  The differential diagnosis includes  intracranial hemorrhage, subdural/epidural hematoma, vertebral fracture, spinal cord injury, muscle strain, skull fracture, fracture.   Co morbidities that complicate the patient evaluation  asthma, DM, CVA, GERD, HTN, seizures    Additional history obtained:  No PCP listed in chart    Problem List / ED Course / Critical interventions / Medication management  Patient presented for mechanical fall leading to knee pain x2 days ago. Patient with stable vitals and does not appear to be in distress. Physical exam reassuring. Patient afebrile with stable vitals. I ordered imaging studies including knee xray and CT head/cervical spine . I independently visualized and interpreted imaging which showed no acute process. I agree with the radiologist interpretation. There is  thyroid nodule which I educated patient about and recommended following up with PCP - patient verbalized understanding of plan. Shared all results with patient.  Answered all questions.  Provided patient with knee brace.  Provided patient with orthopedic follow-up. I have reviewed the patients home medicines and have made adjustments as needed The patient has been appropriately medically screened and/or stabilized in the ED. I have low suspicion for any other emergent medical condition which would require further screening, evaluation or treatment in the ED or require inpatient management. At time  of discharge the patient is hemodynamically stable and in no acute distress. I have discussed work-up results and diagnosis with patient and answered all questions. Patient is agreeable with discharge plan. We discussed strict return precautions for returning to the emergency department and they verbalized understanding.     Social Determinants of Health:  none        Final diagnoses:  Acute pain of right knee  Concussion without loss of consciousness, initial encounter    ED Discharge Orders     None          Hoy Nidia FALCON, [place] 10/15/23 1131    Ellouise Fine K, [place] 10/15/23 1526
